# Patient Record
Sex: Female | Born: 1970 | Race: White | Hispanic: No | Marital: Married | Smoking: Current every day smoker
Health system: Southern US, Community
[De-identification: ages and names within clinical notes are randomized; demographics above are authoritative.]

## PROBLEM LIST (undated history)

## (undated) DIAGNOSIS — M40202 Unspecified kyphosis, cervical region: Secondary | ICD-10-CM

## (undated) DIAGNOSIS — I2510 Atherosclerotic heart disease of native coronary artery without angina pectoris: Secondary | ICD-10-CM

## (undated) DIAGNOSIS — R112 Nausea with vomiting, unspecified: Secondary | ICD-10-CM

## (undated) DIAGNOSIS — T4145XA Adverse effect of unspecified anesthetic, initial encounter: Secondary | ICD-10-CM

## (undated) DIAGNOSIS — Z9889 Other specified postprocedural states: Secondary | ICD-10-CM

## (undated) DIAGNOSIS — T8859XA Other complications of anesthesia, initial encounter: Secondary | ICD-10-CM

## (undated) DIAGNOSIS — G8929 Other chronic pain: Secondary | ICD-10-CM

## (undated) DIAGNOSIS — M19019 Primary osteoarthritis, unspecified shoulder: Secondary | ICD-10-CM

## (undated) HISTORY — DX: Atherosclerotic heart disease of native coronary artery without angina pectoris: I25.10

## (undated) HISTORY — PX: TONSILLECTOMY: SUR1361

## (undated) HISTORY — DX: Unspecified kyphosis, cervical region: M40.202

## (undated) HISTORY — DX: Primary osteoarthritis, unspecified shoulder: M19.019

## (undated) HISTORY — PX: FOOT SURGERY: SHX648

## (undated) HISTORY — PX: APPENDECTOMY: SHX54

## (undated) HISTORY — PX: ABDOMINAL HYSTERECTOMY: SHX81

## (undated) HISTORY — DX: Other chronic pain: G89.29

## (undated) HISTORY — PX: TUBAL LIGATION: SHX77

---

## 1997-09-10 ENCOUNTER — Ambulatory Visit (HOSPITAL_COMMUNITY): Admission: RE | Admit: 1997-09-10 | Discharge: 1997-09-10 | Payer: Self-pay | Admitting: Obstetrics and Gynecology

## 1998-01-06 ENCOUNTER — Encounter (HOSPITAL_COMMUNITY): Admission: RE | Admit: 1998-01-06 | Discharge: 1998-01-20 | Payer: Self-pay | Admitting: Obstetrics and Gynecology

## 1998-01-10 ENCOUNTER — Inpatient Hospital Stay (HOSPITAL_COMMUNITY): Admission: AD | Admit: 1998-01-10 | Discharge: 1998-01-10 | Payer: Self-pay | Admitting: Obstetrics & Gynecology

## 1998-01-20 ENCOUNTER — Inpatient Hospital Stay (HOSPITAL_COMMUNITY): Admission: AD | Admit: 1998-01-20 | Discharge: 1998-01-23 | Payer: Self-pay | Admitting: Obstetrics & Gynecology

## 1998-02-12 ENCOUNTER — Emergency Department (HOSPITAL_COMMUNITY): Admission: EM | Admit: 1998-02-12 | Discharge: 1998-02-12 | Payer: Self-pay | Admitting: Emergency Medicine

## 1999-04-16 ENCOUNTER — Encounter: Payer: Self-pay | Admitting: Emergency Medicine

## 1999-04-16 ENCOUNTER — Emergency Department (HOSPITAL_COMMUNITY): Admission: EM | Admit: 1999-04-16 | Discharge: 1999-04-16 | Payer: Self-pay | Admitting: Emergency Medicine

## 1999-05-10 ENCOUNTER — Encounter: Admission: RE | Admit: 1999-05-10 | Discharge: 1999-05-18 | Payer: Self-pay | Admitting: Family Medicine

## 1999-06-23 ENCOUNTER — Other Ambulatory Visit: Admission: RE | Admit: 1999-06-23 | Discharge: 1999-06-23 | Payer: Self-pay | Admitting: Obstetrics & Gynecology

## 1999-06-23 ENCOUNTER — Encounter (INDEPENDENT_AMBULATORY_CARE_PROVIDER_SITE_OTHER): Payer: Self-pay

## 1999-12-23 ENCOUNTER — Inpatient Hospital Stay (HOSPITAL_COMMUNITY): Admission: RE | Admit: 1999-12-23 | Discharge: 1999-12-25 | Payer: Self-pay | Admitting: Obstetrics & Gynecology

## 1999-12-23 ENCOUNTER — Encounter (INDEPENDENT_AMBULATORY_CARE_PROVIDER_SITE_OTHER): Payer: Self-pay | Admitting: Specialist

## 2004-02-27 ENCOUNTER — Other Ambulatory Visit: Admission: RE | Admit: 2004-02-27 | Discharge: 2004-02-27 | Payer: Self-pay | Admitting: Family Medicine

## 2004-03-29 ENCOUNTER — Encounter (INDEPENDENT_AMBULATORY_CARE_PROVIDER_SITE_OTHER): Payer: Self-pay | Admitting: *Deleted

## 2004-03-29 ENCOUNTER — Ambulatory Visit (HOSPITAL_COMMUNITY): Admission: RE | Admit: 2004-03-29 | Discharge: 2004-03-29 | Payer: Self-pay | Admitting: Gastroenterology

## 2005-08-02 ENCOUNTER — Encounter: Admission: RE | Admit: 2005-08-02 | Discharge: 2005-08-02 | Payer: Self-pay | Admitting: Family Medicine

## 2008-09-23 ENCOUNTER — Encounter: Admission: RE | Admit: 2008-09-23 | Discharge: 2008-09-23 | Payer: Self-pay | Admitting: Family Medicine

## 2010-06-20 ENCOUNTER — Emergency Department (HOSPITAL_COMMUNITY)
Admission: EM | Admit: 2010-06-20 | Discharge: 2010-06-20 | Disposition: A | Discharge status: Home / Self Care | Payer: Self-pay | Attending: Emergency Medicine | Admitting: Emergency Medicine

## 2010-06-20 ENCOUNTER — Emergency Department (HOSPITAL_COMMUNITY): Payer: Self-pay

## 2010-06-20 DIAGNOSIS — J3489 Other specified disorders of nose and nasal sinuses: Secondary | ICD-10-CM | POA: Insufficient documentation

## 2010-06-20 DIAGNOSIS — R5383 Other fatigue: Secondary | ICD-10-CM | POA: Insufficient documentation

## 2010-06-20 DIAGNOSIS — R609 Edema, unspecified: Secondary | ICD-10-CM | POA: Insufficient documentation

## 2010-06-20 DIAGNOSIS — IMO0001 Reserved for inherently not codable concepts without codable children: Secondary | ICD-10-CM | POA: Insufficient documentation

## 2010-06-20 DIAGNOSIS — F3289 Other specified depressive episodes: Secondary | ICD-10-CM | POA: Insufficient documentation

## 2010-06-20 DIAGNOSIS — R5381 Other malaise: Secondary | ICD-10-CM | POA: Insufficient documentation

## 2010-06-20 DIAGNOSIS — Z79899 Other long term (current) drug therapy: Secondary | ICD-10-CM | POA: Insufficient documentation

## 2010-06-20 DIAGNOSIS — R070 Pain in throat: Secondary | ICD-10-CM | POA: Insufficient documentation

## 2010-06-20 DIAGNOSIS — F329 Major depressive disorder, single episode, unspecified: Secondary | ICD-10-CM | POA: Insufficient documentation

## 2010-06-20 LAB — DIFFERENTIAL
Basophils Absolute: 0 10*3/uL (ref 0.0–0.1)
Basophils Relative: 1 % (ref 0–1)
Eosinophils Absolute: 0.2 10*3/uL (ref 0.0–0.7)
Eosinophils Relative: 2 % (ref 0–5)
Lymphocytes Relative: 27 % (ref 12–46)
Lymphs Abs: 1.8 10*3/uL (ref 0.7–4.0)
Monocytes Absolute: 0.4 10*3/uL (ref 0.1–1.0)
Monocytes Relative: 6 % (ref 3–12)
Neutro Abs: 4.3 10*3/uL (ref 1.7–7.7)
Neutrophils Relative %: 64 % (ref 43–77)

## 2010-06-20 LAB — URINE MICROSCOPIC-ADD ON

## 2010-06-20 LAB — POCT PREGNANCY, URINE: Preg Test, Ur: NEGATIVE

## 2010-06-20 LAB — CBC
Hemoglobin: 12.2 g/dL (ref 12.0–15.0)
MCHC: 34.7 g/dL (ref 30.0–36.0)
WBC: 6.7 10*3/uL (ref 4.0–10.5)

## 2010-06-20 LAB — URINALYSIS, ROUTINE W REFLEX MICROSCOPIC
Glucose, UA: NEGATIVE mg/dL
Leukocytes, UA: NEGATIVE
Protein, ur: NEGATIVE mg/dL
Specific Gravity, Urine: 1.018 (ref 1.005–1.030)
Urobilinogen, UA: 0.2 mg/dL (ref 0.0–1.0)

## 2010-06-20 LAB — BASIC METABOLIC PANEL
Calcium: 9 mg/dL (ref 8.4–10.5)
Chloride: 104 mEq/L (ref 96–112)
Creatinine, Ser: <0.47 mg/dL (ref 0.4–1.2)
Sodium: 137 mEq/L (ref 135–145)

## 2010-06-20 LAB — GLUCOSE, CAPILLARY: Glucose-Capillary: 145 mg/dL — ABNORMAL HIGH (ref 70–99)

## 2010-06-25 NOTE — H&P (Signed)
[facility]  Patient:    Vicki Pena, Vicki Pena                        MRN: [id number] Adm. Date:  12/23/99 Attending:  Gerrit Friends. Aldona Bar, M.D.                         History and Physical  HISTORY:  This patient is a 40 year old, gravida 2, para 3 (one set of twins), admitted for total abdominal hysterectomy with a preoperative diagnosis of worsening menorrhagia and dysmenorrhea.  This problem has been going on essentially for about ten months.  It has not responded to birth control pills or endometrial sampling which was carried out in May 2001, which was essentially within normal limits.  The patients thyroid function likewise has been measured and was found to be within normal limits.  In late September, when the patient presented with a desire to proceed with definitive therapy (hysterectomy), she was also having some stress urinary incontinence which to date has [facility] with physical therapy and the use of the Neotonus chair with improvement.  Therefore, the surgical treatment of her stress urinary incontinence will not be carried out at this time as there has been considerable improvement in her symptomatology as a result of her attendance at the [facility] and [facility].  The patients recent cervical cytology was normal.  Her last delivery was in December 1999, and at that time she underwent a primary low transverse cesarean section and tubal sterilization procedure for permanent elective sterilization at 38-1/2 weeks into her pregnancy.  She had a complication with pregnancy induced hypertension and a twin pregnancy.  She delivered, as mentioned, by cesarean section and did well.  During the entire year of 2000, she did well but began having worsening menorrhagia and dysmenorrhea as 2001 progressed.  She is admitted at this time for total abdominal hysterectomy with a strong suspicion of  adenomyosis as a contributing factor.  PAST MEDICAL HISTORY:  The patient has no known allergies.  She is currently on no medications.  Previous surgery only includes the aforementioned cesarean section and tubal sterilization in December 1999.  FAMILY HISTORY/SOCIAL HISTORY:  Noncontributory.  REVIEW OF SYSTEMS:  Negative, with the exception of above.  PHYSICAL EXAMINATION:  GENERAL:  Examination at the time of admission finds a well-developed female.  VITAL SIGNS:  Weight 196, blood pressure 110/70, respirations 18 and regular, pulse 80 and regular, temperature 98.2.  HEENT:  Negative.  Thyroid not enlarged.  CHEST:  Clear to auscultation and percussion.  BREASTS:  Negative.  CARDIOVASCULAR:  Regular rhythm.  No murmur.  ABDOMEN:  No masses are felt.  There is a well healed Pfannenstiel incision.  PELVIC:  The uterus is normal to upper limits of normal size, relatively immobile, tender to palpation, and adnexal areas are negative.  RECTOVAGINAL:  Confirmatory.  EXTREMITIES:  Negative.  NEUROLOGICAL:  Physiologic.  IMPRESSION:  Menorrhagia and dysmenorrhea - suspicious for adenomyosis.  PLAN:  Total abdominal hysterectomy. DD:  12/21/99 TD:  12/21/99 Job: [id number]

## 2010-06-25 NOTE — Op Note (Signed)
[facility]  Patient:    Vicki Pena, GROUNDS                MRN: [id number] Proc. Date: 12/23/99 Adm. Date:  [id number] Attending:  Mickle Mallory                           Operative Report  AGE:  40  PREOPERATIVE DIAGNOSES: 1. Menorrhea. 2. Dysmenorrhea. 3. Suspected adenomyosis.  POSTOPERATIVE DIAGNOSES: 1. Menorrhea. 2. Dysmenorrhea. 3. Suspected adenomyosis.  PATHOLOGY:  Pending.  PROCEDURE:  Total abdominal hysterectomy.  SURGEON:  Gerrit Friends. Aldona Bar, M.D.  ASSISTANT:  Luvenia Redden, M.D.  ANESTHESIA:  General endotracheal.  DESCRIPTION OF PROCEDURE:  The patient was taken to the operating room.  After satisfactory induction of general endotracheal anesthesia, she was prepped and draped, having been placed in the supine position in the usual manner for abdominal surgery with the Foley catheter inserted as part of the prep.  At this time, the procedure was begun.  A Pfannenstiel incision was made with minimal difficulty and dissected down to and through the fascia in a low transverse fascia with hemostasis created at each layer.  A subfascial space was created inferiorly and superiorly.  The muscle was separated in the midline.  The peritoneum was identified and entered appropriately with care taken to avoid bowel superiorly and bladder inferiorly.  At this time, the abdomen was inspected.  The upper abdomen felt normal as did both kidneys. The uterus was relatively mobile and not significantly enlarged.  Both ovaries likewise looked and felt normal.  The appendix was surgically absent.  At this time, the self-retaining retractor was placed and bowel packed off without difficulty.  A hysterectomy at this time was begun in the usual fashion.  Long Kelly clamps elevated the corners of the uterus.  With minimal difficulty, the round ligaments were suture secured.  With the aid of the Bovie, the round ligaments were cut and the  bladder flap was created.  The ovarian pedicle was then clamped and doubly suture secured and both ovaries were left in.  At this time, the bladder flap was created nicely.  Ureters were identified and uterine artery pedicles were clamped, cut, and suture secured with 0 Vicryl suture.  Additional parametrial bites were taken in a similar fashion down to the vaginal angle, which was secured using curved Heaney clamps and suture ligature thereafter of 0 Vicryl suture.  This was carried out.  The uterosacral pedicles were taken in a similar fashion bilaterally.  Once the vaginal angles were secured, the specimen was removed, removing the uterus and cervix in its entirety.  The remainder of the vaginal cuff was then closed with 0 Vicryl in a figure-of-eight fashion.  Perfuse irrigation was carried out and hemostasis was created.  The ovarian pedicles were then attached to the round ligaments bilaterally, thus elevating them out of the pelvis.  Again, irrigation was carried out and hemostasis was noted to be excellent.  At this time, with the procedure being completed, all packs were removed, all retractors were removed, all counts were noted to be correct, and no foreign bodies were noted to be remaining in the abdominal cavity.  Closure of the abdomen at this time was begun in layers.  The abdominal peritoneum was closed with 0 Vicryl in a running fashion. Muscle secured with same.  Assured of good subfascial hemostasis, the fascia was then secured with 0 Vicryl from angle  to midline bilaterally.  The subcutaneous tissues were then rendered hemostatic and staples were then used to close the skin.  A sterile pressure dressing was applied.  At this time, the patient was transported to the recovery room in satisfactory condition, having tolerated the procedure well.  Estimated blood loss 200 cc. All counts were noted to be correct.  The patient had good clear urine output during the procedure.   In the recovery room, she was noted to be in stable condition. DD:  12/23/99 TD:  12/23/99 Job: [id number]

## 2010-06-25 NOTE — Discharge Summary (Signed)
[facility]  Patient:    Vicki Pena, Vicki Pena                MRN: [id number] Adm. Date:  [id number] Disc. Date: 12/25/99 Attending:  Mickle Mallory                           Discharge Summary  DISCHARGE DIAGNOSIS: 1. Menorrhagia. 2. Dysmenorrhea. 3. Suspected adenomyosis.  PROCEDURE:  Total abdominal hysterectomy - pathology pending.  HOSPITAL COURSE:  This 40 year old female was admitted after a total abdominal hysterectomy because of worsening menorrhagia and dysmenorrhea with a diagnosis of suspected adenomyosis.  The procedure went well and was carried out after a normal preoperative workup.  Her postoperative course was totally benign.  Her discharge hemoglobin was 11.9.  She remained totally afebrile during her hospital course and on the morning was ambulating well, tolerating a regular diet well, having normal bowel and bladder function, was afebrile, her wound was clean and dry, and she was deemed ready for discharge. DISCHARGE INSTRUCTIONS:  Accordingly, she was given all appropriate instructions at the time of discharge.  DISCHARGE MEDICATIONS:  She was given prescriptions for: 1. Motrin 600 mg q.6h. 2. Tylox one to two q.4-6h. p.r.n. pain. 3. She will over-the-counter Doxidan and/or Mylicon as needed for GI problems.  FOLLOWUP:  She will return to the office in 48 hours for staple removal and Steri-Strip placement.  As mentioned, pathology report at the time of discharge pending.  CONDITION ON DISCHARGE:  Improved. DD:  12/25/99 TD:  12/26/99 Job: [id number]

## 2010-06-25 NOTE — Op Note (Signed)
Vicki Pena, Vicki Pena               ACCOUNT NO.:  [id number]   MEDICAL RECORD NO.:  [id number]          PATIENT TYPE:  AMB   LOCATION:  ENDO                         FACILITY:  [facility]   PHYSICIAN:  Graylin Shiver, M.D.   DATE OF BIRTH:  1970/02/08   DATE OF PROCEDURE:  03/29/2004  DATE OF DISCHARGE:                                 OPERATIVE REPORT   PROCEDURE:  Colonoscopy with biopsy.   INDICATIONS:  Rectal bleeding.   Informed consent was obtained after explanation of the risks of bleeding,  infection and perforation.   PREMEDICATION:  Fentanyl 100 mcg IV, Versed 10 mg IV.   PROCEDURE:  With the patient in the left lateral decubitus position a rectal  exam was performed. No masses were felt. The Olympus colonoscope was  inserted into the rectum and advanced around the colon to the cecum. Cecal  landmarks were identified. The ileocecal valve appeared prominent the  ileocecal valve was intubated and the first few centimeters of terminal  ileum were inspected and looked normal. The cecum looked normal. The  ileocecal valve was prominent. Biopsies were obtained from the prominent  ileocecal valve for histological inspection. The ascending colon looked  normal. The transverse colon looked normal. The descending colon and sigmoid  looked normal.  In the rectum there were 3 small polyps that were biopsied  off with cold forceps.  The scope was retroflexed in the rectum and no  specific abnormalities were noted. She tolerated the procedure well without  complications.   IMPRESSION:  1.  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  2.  Three small rectal polyps biopsied off.   PLAN:  The pathology will be checked.      SFG/MEDQ  D:  03/29/2004  T:  03/29/2004  Job:  [id number]   cc:   Donia Guiles, M.D.  [address] [place] [employer]  [place] [address]  Fax: [phone]

## 2011-12-03 ENCOUNTER — Emergency Department (HOSPITAL_COMMUNITY)
Admission: EM | Admit: 2011-12-03 | Discharge: 2011-12-03 | Disposition: A | Discharge status: Home / Self Care | Payer: Self-pay | Attending: Emergency Medicine | Admitting: Emergency Medicine

## 2011-12-03 DIAGNOSIS — N390 Urinary tract infection, site not specified: Secondary | ICD-10-CM | POA: Insufficient documentation

## 2011-12-03 DIAGNOSIS — R000 Tachycardia, unspecified: Secondary | ICD-10-CM | POA: Insufficient documentation

## 2011-12-03 DIAGNOSIS — Z79899 Other long term (current) drug therapy: Secondary | ICD-10-CM | POA: Insufficient documentation

## 2011-12-03 DIAGNOSIS — R300 Dysuria: Secondary | ICD-10-CM | POA: Insufficient documentation

## 2011-12-03 LAB — URINALYSIS, ROUTINE W REFLEX MICROSCOPIC
Glucose, UA: NEGATIVE mg/dL
Ketones, ur: 15 mg/dL — AB
pH: 6 (ref 5.0–8.0)

## 2011-12-03 LAB — URINE MICROSCOPIC-ADD ON

## 2011-12-03 MED ORDER — PROPOFOL 10 MG/ML IV EMUL
INTRAVENOUS | Status: AC
  Filled 2011-12-03: qty 100

## 2011-12-03 MED ORDER — SULFAMETHOXAZOLE-TMP DS 800-160 MG PO TABS
1.0000 | ORAL_TABLET | Freq: Once | ORAL | Status: AC
  Administered 2011-12-03: 1 via ORAL
  Filled 2011-12-03: qty 1

## 2011-12-03 MED ORDER — PHENAZOPYRIDINE HCL 200 MG PO TABS: 200.0000 mg | ORAL_TABLET | Freq: Three times a day (TID) | ORAL | Status: DC

## 2011-12-03 MED ORDER — ETOMIDATE 2 MG/ML IV SOLN
INTRAVENOUS | Status: AC
  Filled 2011-12-03: qty 20

## 2011-12-03 MED ORDER — SUCCINYLCHOLINE CHLORIDE 20 MG/ML IJ SOLN
INTRAMUSCULAR | Status: AC
  Filled 2011-12-03: qty 1

## 2011-12-03 MED ORDER — SULFAMETHOXAZOLE-TRIMETHOPRIM 800-160 MG PO TABS: 1.0000 | ORAL_TABLET | Freq: Two times a day (BID) | ORAL | Status: AC

## 2011-12-03 MED ORDER — LIDOCAINE HCL (CARDIAC) 20 MG/ML IV SOLN
INTRAVENOUS | Status: AC
  Filled 2011-12-03: qty 5

## 2011-12-03 MED ORDER — ROCURONIUM BROMIDE 50 MG/5ML IV SOLN
INTRAVENOUS | Status: AC
  Filled 2011-12-03: qty 2

## 2011-12-03 MED ORDER — FLUCONAZOLE 200 MG PO TABS: 200.0000 mg | ORAL_TABLET | Freq: Every day | ORAL | Status: AC

## 2011-12-03 NOTE — ED Provider Notes (Signed)
History     [name]: [id number]  Arrival date & time 12/03/11  7829   First MD Initiated Contact with Patient 12/03/11 [phone]      Chief Complaint  Patient presents with  . Urinary Frequency    HPI  The patient presents concerns of increased dysuria and urinary frequency.  Symptoms began approximately one week ago.  Since onset symptoms have been progressive.  She notes mild improvement with azo.  No associated fevers, chills, vomiting.  No other focal complaints. The patient has a history of UTI in the distant past.  She states the symptoms are similar. The patient had a hysterectomy  No past medical history on file.  No past surgical history on file.  No family history on file.  History  Substance Use Topics  . Smoking status: Not on file  . Smokeless tobacco: Not on file  . Alcohol Use: Not on file    OB History    No data available      Review of Systems  All other systems reviewed and are negative.    Allergies  Review of patient's allergies indicates no known allergies.  Home Medications   Current Outpatient Rx  Name Route Sig Dispense Refill  . AZO TABS PO Oral Take 1 tablet by mouth daily as needed. Takes 1 tab up to 6 times daily as needed for urinary tract discomfort    . FLUCONAZOLE 200 MG PO TABS Oral Take 1 tablet (200 mg total) by mouth daily. 7 tablet 0  . PHENAZOPYRIDINE HCL 200 MG PO TABS Oral Take 1 tablet (200 mg total) by mouth 3 (three) times daily. 6 tablet 0  . SULFAMETHOXAZOLE-TRIMETHOPRIM 800-160 MG PO TABS Oral Take 1 tablet by mouth 2 (two) times daily. 10 tablet 0    BP 132/85  Pulse 110  Temp 98.2 F (36.8 C) (Oral)  Resp 20  SpO2 97%  LMP 02/07/1998  Physical Exam  Nursing note and vitals reviewed. Constitutional: She is oriented to person, place, and time. She appears well-developed and well-nourished. No distress.  HENT:  Head: Normocephalic and atraumatic.  Eyes: Conjunctivae normal and EOM are normal.  Cardiovascular:  Normal rate and regular rhythm.   Pulmonary/Chest: Effort normal and breath sounds normal. No stridor. No respiratory distress.  Abdominal: She exhibits no distension.  Musculoskeletal: She exhibits no edema.  Neurological: She is alert and oriented to person, place, and time. No cranial nerve deficit.  Skin: Skin is warm and dry.  Psychiatric: She has a normal mood and affect.    ED Course  Procedures (including critical care time)  Labs Reviewed  URINALYSIS, ROUTINE W REFLEX MICROSCOPIC - Abnormal; Notable for the following:    Color, Urine ORANGE (*)  BIOCHEMICALS MAY BE AFFECTED BY COLOR   APPearance CLOUDY (*)     Hgb urine dipstick MODERATE (*)     Bilirubin Urine SMALL (*)     Ketones, ur 15 (*)     Urobilinogen, UA 2.0 (*)     Nitrite POSITIVE (*)     Leukocytes, UA MODERATE (*)     All other components within normal limits  URINE MICROSCOPIC-ADD ON - Abnormal; Notable for the following:    Bacteria, UA MANY (*)     All other components within normal limits  URINE CULTURE   No results found.   1. UTI (lower urinary tract infection)       MDM  The patient presents with concerns of dysuria, urinary frequency.  On  exam she is mildly tachycardic, but afebrile and in no distress.  The patient's labs are consistent with a urinary tract infection.  Absent distress, fever, there is low suspicion for pyelonephritis.  The patient requested antifungal to go with her antibiotics.  This was accommodated.     Gerhard Munch, MD 12/03/11 [phone]

## 2011-12-05 LAB — URINE CULTURE

## 2011-12-06 NOTE — ED Notes (Signed)
+  Urine Treated with Septra-Resistant to same-chart sent to EDP office for review.

## 2011-12-07 ENCOUNTER — Emergency Department (HOSPITAL_COMMUNITY)
Admission: EM | Admit: 2011-12-07 | Discharge: 2011-12-07 | Disposition: A | Discharge status: Home / Self Care | Payer: Self-pay | Attending: Emergency Medicine | Admitting: Emergency Medicine

## 2011-12-07 ENCOUNTER — Encounter (HOSPITAL_COMMUNITY): Payer: Self-pay | Admitting: Emergency Medicine

## 2011-12-07 DIAGNOSIS — F17200 Nicotine dependence, unspecified, uncomplicated: Secondary | ICD-10-CM | POA: Insufficient documentation

## 2011-12-07 DIAGNOSIS — N12 Tubulo-interstitial nephritis, not specified as acute or chronic: Secondary | ICD-10-CM | POA: Insufficient documentation

## 2011-12-07 DIAGNOSIS — Z79899 Other long term (current) drug therapy: Secondary | ICD-10-CM | POA: Insufficient documentation

## 2011-12-07 LAB — URINALYSIS, ROUTINE W REFLEX MICROSCOPIC
Nitrite: POSITIVE — AB
Specific Gravity, Urine: 1.028 (ref 1.005–1.030)
pH: 5 (ref 5.0–8.0)

## 2011-12-07 LAB — URINE MICROSCOPIC-ADD ON

## 2011-12-07 MED ORDER — SODIUM CHLORIDE 0.9 % IV SOLN
1000.0000 mL | Freq: Once | INTRAVENOUS | Status: AC
  Administered 2011-12-07: 1000 mL via INTRAVENOUS

## 2011-12-07 MED ORDER — HYDROMORPHONE HCL PF 1 MG/ML IJ SOLN
1.0000 mg | Freq: Once | INTRAMUSCULAR | Status: AC
  Administered 2011-12-07: 1 mg via INTRAVENOUS
  Filled 2011-12-07: qty 1

## 2011-12-07 MED ORDER — DEXTROSE 5 % IV SOLN
1.0000 g | Freq: Once | INTRAVENOUS | Status: AC
  Administered 2011-12-07: 1 g via INTRAVENOUS
  Filled 2011-12-07: qty 10

## 2011-12-07 MED ORDER — CEPHALEXIN 500 MG PO CAPS: 500.0000 mg | ORAL_CAPSULE | Freq: Four times a day (QID) | ORAL | Status: DC

## 2011-12-07 MED ORDER — PHENAZOPYRIDINE HCL 200 MG PO TABS: 200.0000 mg | ORAL_TABLET | Freq: Three times a day (TID) | ORAL | Status: DC

## 2011-12-07 MED ORDER — HYDROCODONE-ACETAMINOPHEN 5-325 MG PO TABS: 1.0000 | ORAL_TABLET | ORAL | Status: DC | PRN

## 2011-12-07 MED ORDER — SODIUM CHLORIDE 0.9 % IV SOLN: 1000.0000 mL | INTRAVENOUS | Status: DC

## 2011-12-07 NOTE — ED Notes (Addendum)
C/o UTI symptoms x 1 week. Seen here for same last Saturday but not getting better. Continues with dysuria, frequency & now pain worse & also having chills. Denies hematuria

## 2011-12-07 NOTE — ED Provider Notes (Signed)
History     [name]: [id number]  Arrival date & time 12/07/11  1533   First MD Initiated Contact with Patient 12/07/11 1718      Chief Complaint  Patient presents with  . Urinary Tract Infection     The history is provided by the patient and medical records.   patient was seen for her symptoms consistent with urinary tract infection 4 days ago.  She was started on Bactrim.  She states that her symptoms did not seem to be improving.  She continues to have dysuria and urinary frequency.  She has suprapubic pain.  She reports nausea without vomiting.  She reports chills without documented fever.  She reports that she just continues to feel worse.  She denies flank pain.  She has no history of kidney stones.  She denies vaginal complaints.  She reports her symptoms are mild to moderate in severity.  Nothing worsens or improves her symptoms.  She's been compliant with the Bactrim.  Review of the medical records demonstrates urine culture was obtained and the urine is growing out Escherichia coli which is resistant to Bactrim.  It is otherwise sensitive to all other antibiotics  History reviewed. No pertinent past medical history.  Past Surgical History  Procedure Date  . Abdominal hysterectomy   . Tonsillectomy   . Cesarean section   . Foot surgery     History reviewed. No pertinent family history.  History  Substance Use Topics  . Smoking status: Current Every Day Smoker -- 1.0 packs/day    Types: Cigarettes  . Smokeless tobacco: Not on file  . Alcohol Use: Yes     occasion    OB History    Grav Para Term Preterm Abortions TAB SAB Ect Mult Living                  Review of Systems  All other systems reviewed and are negative.    Allergies  Review of patient's allergies indicates no known allergies.  Home Medications   Current Outpatient Rx  Name Route Sig Dispense Refill  . FLUCONAZOLE 200 MG PO TABS Oral Take 1 tablet (200 mg total) by mouth daily. 7 tablet 0  .  PHENAZOPYRIDINE HCL 200 MG PO TABS Oral Take 1 tablet (200 mg total) by mouth 3 (three) times daily. 6 tablet 0  . AZO TABS PO Oral Take 1 tablet by mouth daily as needed. Takes 1 tab up to 6 times daily as needed for urinary tract discomfort    . SULFAMETHOXAZOLE-TRIMETHOPRIM 800-160 MG PO TABS Oral Take 1 tablet by mouth 2 (two) times daily. 10 tablet 0  . CEPHALEXIN 500 MG PO CAPS Oral Take 1 capsule (500 mg total) by mouth 4 (four) times daily. 40 capsule 0  . HYDROCODONE-ACETAMINOPHEN 5-325 MG PO TABS Oral Take 1 tablet by mouth every 4 (four) hours as needed for pain. 20 tablet 0  . PHENAZOPYRIDINE HCL 200 MG PO TABS Oral Take 1 tablet (200 mg total) by mouth 3 (three) times daily. 6 tablet 0    BP 134/81  Pulse 78  Temp 97.7 F (36.5 C) (Oral)  Resp 18  SpO2 96%  LMP 02/07/1998  Physical Exam  Nursing note and vitals reviewed. Constitutional: She is oriented to person, place, and time. She appears well-developed and well-nourished. No distress.  HENT:  Head: Normocephalic and atraumatic.  Eyes: EOM are normal.  Neck: Normal range of motion.  Cardiovascular: Normal rate, regular rhythm and normal heart sounds.  Pulmonary/Chest: Effort normal and breath sounds normal.  Abdominal: Soft. She exhibits no distension. There is no tenderness.       Mild suprapubic tenderness  Musculoskeletal: Normal range of motion.  Neurological: She is alert and oriented to person, place, and time.  Skin: Skin is warm and dry.  Psychiatric: She has a normal mood and affect. Judgment normal.    ED Course  Procedures (including critical care time)  Labs Reviewed  URINALYSIS, ROUTINE W REFLEX MICROSCOPIC - Abnormal; Notable for the following:    Color, Urine ORANGE (*)  BIOCHEMICALS MAY BE AFFECTED BY COLOR   APPearance HAZY (*)     Hgb urine dipstick MODERATE (*)     Bilirubin Urine SMALL (*)     Ketones, ur 15 (*)     Protein, ur 30 (*)     Urobilinogen, UA 2.0 (*)     Nitrite POSITIVE  (*)     Leukocytes, UA MODERATE (*)     All other components within normal limits  URINE MICROSCOPIC-ADD ON - Abnormal; Notable for the following:    Squamous Epithelial / LPF MANY (*)     Bacteria, UA FEW (*)     All other components within normal limits  URINE CULTURE   No results found.   1. Pyelonephritis       MDM  7:44 PM The patient feels much better at this time.  I reviewed her urine culture.  Her urine demonstrated an Escherichia coli urinary tract infection that was pansensitive except for trimethoprim sulfamethoxazole which is what she was discharged home on.  The patient received a dose of IV Rocephin emergency department.  She feels much better.  Home with Keflex Pyridium and pain medicine.  She understands return to the ER for new or worsening symptoms.  No vomiting.  Tolerating oral liquids.      Lyanne Co, MD 12/08/11 [phone]

## 2011-12-07 NOTE — ED Notes (Signed)
Pt reports was here on Sat for UTI, and was tx and sent home on abx, has been taking abx as prescribed; reports increased lower abd spasm and urinary frequency, also c/o chills

## 2011-12-09 LAB — URINE CULTURE

## 2011-12-09 NOTE — ED Notes (Signed)
Patient return  And put on Kelfex by Dr Patria Mane.

## 2011-12-10 NOTE — ED Notes (Signed)
+  Urine. Patient treated with Keflex. Sensitive to same. Per protocol MD. °

## 2012-02-09 ENCOUNTER — Encounter: Payer: Self-pay | Admitting: Internal Medicine

## 2012-02-09 ENCOUNTER — Other Ambulatory Visit: Payer: Self-pay | Admitting: *Deleted

## 2012-02-09 ENCOUNTER — Ambulatory Visit: Payer: Self-pay | Admitting: Internal Medicine

## 2012-02-09 VITALS — BP 125/83 | HR 97 | Temp 98.2°F | Resp 16 | Ht 64.0 in | Wt 173.0 lb

## 2012-02-09 DIAGNOSIS — A499 Bacterial infection, unspecified: Secondary | ICD-10-CM

## 2012-02-09 DIAGNOSIS — N390 Urinary tract infection, site not specified: Secondary | ICD-10-CM

## 2012-02-09 DIAGNOSIS — R300 Dysuria: Secondary | ICD-10-CM

## 2012-02-09 DIAGNOSIS — R319 Hematuria, unspecified: Secondary | ICD-10-CM

## 2012-02-09 LAB — POCT URINALYSIS DIPSTICK
Protein, UA: 100
Urobilinogen, UA: 4

## 2012-02-09 LAB — POCT UA - MICROSCOPIC ONLY
Mucus, UA: NEGATIVE
Yeast, UA: NEGATIVE

## 2012-02-09 MED ORDER — PHENAZOPYRIDINE HCL 200 MG PO TABS: 200.0000 mg | ORAL_TABLET | Freq: Three times a day (TID) | ORAL | Status: DC

## 2012-02-09 MED ORDER — SULFAMETHOXAZOLE-TRIMETHOPRIM 800-160 MG PO TABS: 1.0000 | ORAL_TABLET | Freq: Two times a day (BID) | ORAL | Status: DC

## 2012-02-09 MED ORDER — CIPROFLOXACIN HCL 500 MG PO TABS: 500.0000 mg | ORAL_TABLET | Freq: Two times a day (BID) | ORAL | Status: DC

## 2012-02-09 NOTE — Progress Notes (Signed)
  Subjective:    Patient ID: Vicki Pena, female    DOB: 1970/02/23, 42 y.o.   MRN: [id number]  HPI  Dsyuria, burning with urination. Onset today . Blood in urine. No fever. No back pain. No nausea or vomiting. Had a previous uti 1 month ago . Had e coli and it was resistant to the first antibiotic so she had to take a second course of antibiotic. Review of Systems  All other systems reviewed and are negative.       Objective:   Physical Exam  Constitutional: She is oriented to person, place, and time. She appears well-developed and well-nourished.  HENT:  Head: Normocephalic and atraumatic.  Right Ear: External ear normal.  Left Ear: External ear normal.  Nose: Nose normal.  Mouth/Throat: Oropharynx is clear and moist.  Eyes: Conjunctivae normal and EOM are normal. Pupils are equal, round, and reactive to light.  Neck: Normal range of motion. Neck supple.  Cardiovascular: Normal rate, regular rhythm, normal heart sounds and intact distal pulses.   Pulmonary/Chest: Effort normal and breath sounds normal.  Abdominal: Soft. There is tenderness.       Suprapubic tenderness  Musculoskeletal: Normal range of motion.  Neurological: She is alert and oriented to person, place, and time. She has normal reflexes.  Skin: Skin is warm and dry.  Psychiatric: She has a normal mood and affect. Her behavior is normal. Judgment and thought content normal.     Results for orders placed in visit on 02/09/12  POCT URINALYSIS DIPSTICK      Component Value Range   Color, UA orange     Clarity, UA cloudy     Glucose, UA 250     Bilirubin, UA small     Ketones, UA trace     Spec Grav, UA 1.010     Blood, UA moderate     pH, UA 5.0     Protein, UA 100     Urobilinogen, UA 4.0     Nitrite, UA pos     Leukocytes, UA large (3+)    POCT UA - MICROSCOPIC ONLY      Component Value Range   WBC, Ur, HPF, POC 30-40     RBC, urine, microscopic 15-20     Bacteria, U Microscopic small     Mucus,  UA neg     Epithelial cells, urine per micros 3-6     Crystals, Ur, HPF, POC neg     Casts, Ur, LPF, POC neg     Yeast, UA neg     Urine with blood, wbc, nitrite bacteria.    Assessment & Plan:  Urinary tract infection, hematuria. Last uti was ecoli esistant to keflex and bactrim. Will obtain culture.Will treat withcipro1 tab 2 times daily for 10 days and pyriduim. Increase fluids. Acetaminophen for pain.urine culture obtained.

## 2012-02-09 NOTE — Patient Instructions (Addendum)
Increase fluids. Take meds as directed/. Any problems return to the office.

## 2012-02-13 ENCOUNTER — Telehealth: Payer: Self-pay

## 2012-02-13 LAB — URINE CULTURE: Colony Count: >=100000

## 2012-02-13 NOTE — Telephone Encounter (Signed)
Patient would like more pain medication if possible please call [phone]

## 2012-02-14 NOTE — Telephone Encounter (Signed)
What pain medication? Pyridium? Her culture shows that the cipro should have worked-if she is still needing pyridium, she should RTC so we can see if something else is going on.

## 2012-02-14 NOTE — Telephone Encounter (Signed)
Called patient to advise. Left message for her to call me back  

## 2012-02-14 NOTE — Telephone Encounter (Signed)
Pt was seen on 02/09/11.  Her lab results are back.

## 2012-02-15 ENCOUNTER — Telehealth: Payer: Self-pay

## 2012-02-15 NOTE — Telephone Encounter (Signed)
See notes under phone message from 02/15/12.

## 2012-02-15 NOTE — Telephone Encounter (Signed)
Yes she should finish the full course of Cipro.  I am not sure why there was Bactrim at the pharmacy, it does not look like Dr. Mindi Junker ordered this.  Pyridium is not to be continued for longer than 2 days, which is why Dr. Mindi Junker only ordered #6.  If she is still having symptoms she needs to return for further evaluation.

## 2012-02-15 NOTE — Telephone Encounter (Signed)
Advised pt that it is not recommended that she take Pyridium for more than 2 days and she should also not take any more of the Azo. Pt stated that pain is better today than yesterday, and she doesn't have ins, she will wait to see if she continues to improve on the cipro. Advised pt that she can use ibuprofen/tylenol for pain and try some heat on her abdomen or lower back. Also advised pt to make sure she takes her Abx w/food to lessen pt's slight nausea she is experiencing from Abx and to take probiotics to prevent more GI problems. Pt agreed.

## 2012-02-15 NOTE — Telephone Encounter (Signed)
Called pt and gave her results/instr's to complete her Cipro Abx (see notes under lab results). Pt reported that when she got to the pharmacy there were 3 Rx for her - the pyridium and cipro, but also Bactrim. Pt didn't know that provider was going to send in two different Abxs so she took the Bactrim first, and just started Cipro on Monday because she wasn't getting any better and really hurt on Monday. She stated that today might be slightly better, but requests some more Pyridium if possible - Dr Mindi Junker only gave her #6 tabs. I instr'd pt to finish whole round of cipro and CB if Sxs don't completely resolve. She is worried bc the last UTI she had they had to put her on twice as much Cipro for 20 days to get rid of it.  Chelle, can we send in more Pyridium for pt?

## 2012-02-15 NOTE — Telephone Encounter (Signed)
PT STATES SHE HAD A REALLY BAD UTI AND WOULD LIKE TO KNOW IF THE CULTURE HAD COME BACK, IS STILL TAKING ALL THE MEDICINE, BUT STILL HAVE THE BURNING PLEASE CALL AND ADVISE AT [phone]

## 2012-07-22 ENCOUNTER — Emergency Department (HOSPITAL_COMMUNITY)
Admission: EM | Admit: 2012-07-22 | Discharge: 2012-07-22 | Disposition: A | Discharge status: Home / Self Care | Payer: Self-pay | Attending: Emergency Medicine | Admitting: Emergency Medicine

## 2012-07-22 ENCOUNTER — Encounter (HOSPITAL_COMMUNITY): Payer: Self-pay | Admitting: Emergency Medicine

## 2012-07-22 DIAGNOSIS — R509 Fever, unspecified: Secondary | ICD-10-CM | POA: Insufficient documentation

## 2012-07-22 DIAGNOSIS — J020 Streptococcal pharyngitis: Secondary | ICD-10-CM | POA: Insufficient documentation

## 2012-07-22 DIAGNOSIS — R5381 Other malaise: Secondary | ICD-10-CM | POA: Insufficient documentation

## 2012-07-22 DIAGNOSIS — IMO0001 Reserved for inherently not codable concepts without codable children: Secondary | ICD-10-CM | POA: Insufficient documentation

## 2012-07-22 DIAGNOSIS — R61 Generalized hyperhidrosis: Secondary | ICD-10-CM | POA: Insufficient documentation

## 2012-07-22 DIAGNOSIS — R51 Headache: Secondary | ICD-10-CM | POA: Insufficient documentation

## 2012-07-22 DIAGNOSIS — F17200 Nicotine dependence, unspecified, uncomplicated: Secondary | ICD-10-CM | POA: Insufficient documentation

## 2012-07-22 DIAGNOSIS — R5383 Other fatigue: Secondary | ICD-10-CM | POA: Insufficient documentation

## 2012-07-22 LAB — BASIC METABOLIC PANEL
CO2: 23 mEq/L (ref 19–32)
Calcium: 9 mg/dL (ref 8.4–10.5)
Creatinine, Ser: 0.53 mg/dL (ref 0.50–1.10)
GFR calc Af Amer: >90 mL/min (ref >90)
GFR calc non Af Amer: >90 mL/min (ref >90)
Sodium: 135 mEq/L (ref 135–145)

## 2012-07-22 LAB — CBC WITH DIFFERENTIAL/PLATELET
Basophils Absolute: 0 10*3/uL (ref 0.0–0.1)
Basophils Relative: 0 % (ref 0–1)
Eosinophils Relative: 0 % (ref 0–5)
HCT: 39.5 % (ref 36.0–46.0)
Lymphocytes Relative: 6 % — ABNORMAL LOW (ref 12–46)
MCHC: 33.4 g/dL (ref 30.0–36.0)
MCV: 88 fL (ref 78.0–100.0)
Monocytes Absolute: 1 10*3/uL (ref 0.1–1.0)
Platelets: 279 10*3/uL (ref 150–400)
RDW: 13.2 % (ref 11.5–15.5)
WBC: 22.6 10*3/uL — ABNORMAL HIGH (ref 4.0–10.5)

## 2012-07-22 LAB — RAPID STREP SCREEN (MED CTR MEBANE ONLY): Streptococcus, Group A Screen (Direct): POSITIVE — AB

## 2012-07-22 MED ORDER — PENICILLIN G BENZATHINE 1200000 UNIT/2ML IM SUSP
1.2000 10*6.[IU] | Freq: Once | INTRAMUSCULAR | Status: AC
  Administered 2012-07-22: 1.2 10*6.[IU] via INTRAMUSCULAR
  Filled 2012-07-22: qty 2

## 2012-07-22 NOTE — ED Provider Notes (Signed)
History     [name]: [id number]  Arrival date & time 07/22/12  2116   None     Chief Complaint  Patient presents with  . Sore Throat  . Headache    (Consider location/radiation/quality/duration/timing/severity/associated sxs/prior treatment) Patient is a 42 y.o. female presenting with pharyngitis and headaches. The history is provided by the patient. No language interpreter was used.  Sore Throat This is a new problem. The current episode started in the past 7 days. The problem occurs constantly. The problem has been gradually worsening. Associated symptoms include chills, diaphoresis, fatigue, a fever (subjective), headaches (resolved), myalgias and a sore throat. Pertinent negatives include no abdominal pain, chest pain, congestion, nausea, neck pain, numbness, rash, vomiting or weakness. The symptoms are aggravated by swallowing, coughing, drinking and eating. She has tried nothing for the symptoms.  Headache Associated symptoms: fatigue, fever (subjective), myalgias and sore throat   Associated symptoms: no abdominal pain, no congestion, no nausea, no neck pain, no neck stiffness, no numbness and no vomiting     History reviewed. No pertinent past medical history.  Past Surgical History  Procedure Laterality Date  . Abdominal hysterectomy    . Tonsillectomy    . Cesarean section    . Foot surgery      No family history on file.  History  Substance Use Topics  . Smoking status: Current Every Day Smoker -- 1.00 packs/day    Types: Cigarettes  . Smokeless tobacco: Not on file  . Alcohol Use: Yes     Comment: occasion    OB History   Grav Para Term Preterm Abortions TAB SAB Ect Mult Living                  Review of Systems  Constitutional: Positive for fever (subjective), chills, diaphoresis and fatigue.  HENT: Positive for sore throat. Negative for congestion, rhinorrhea, trouble swallowing, neck pain and neck stiffness.        Discomfort with swallowing   Respiratory: Negative for shortness of breath and wheezing.   Cardiovascular: Negative for chest pain.  Gastrointestinal: Negative for nausea, vomiting and abdominal pain.  Genitourinary: Negative for dysuria and hematuria.  Musculoskeletal: Positive for myalgias.  Skin: Negative for rash.  Neurological: Positive for headaches (resolved). Negative for weakness and numbness.  All other systems reviewed and are negative.    Allergies  Review of patient's allergies indicates no known allergies.  Home Medications   Current Outpatient Rx  Name  Route  Sig  Dispense  Refill  . diphenhydrAMINE (BENADRYL) 12.5 MG/5ML elixir   Oral   Take 25 mg by mouth 4 (four) times daily as needed for itching or allergies.         . naproxen sodium (ANAPROX) 220 MG tablet   Oral   Take 220 mg by mouth 2 (two) times daily with a meal.           BP 107/65  Pulse 116  Temp(Src) 98.9 F (37.2 C) (Oral)  Resp 15  Ht 5\' 4"  (1.626 m)  Wt 170 lb (77.111 kg)  BMI 29.17 kg/m2  SpO2 96%  LMP 02/07/1998  Physical Exam  Nursing note and vitals reviewed. Constitutional: She is oriented to person, place, and time. She appears well-developed and well-nourished. No distress.  HENT:  Head: Normocephalic and atraumatic. No trismus in the jaw.  Right Ear: Tympanic membrane, external ear and ear canal normal. No tenderness. No mastoid tenderness.  Left Ear: Tympanic membrane, external ear and ear  canal normal. No tenderness. No mastoid tenderness.  Nose: Nose normal.  Mouth/Throat: Uvula is midline and mucous membranes are normal. Mucous membranes are not pale. No oral lesions. Posterior oropharyngeal erythema present. No posterior oropharyngeal edema or tonsillar abscesses.  Posterior pharyngeal erythema without edema. Airway patent. No stridor. Tonsils absent.  Eyes: Conjunctivae and EOM are normal. Pupils are equal, round, and reactive to light. Right eye exhibits no discharge. Left eye exhibits no  discharge. No scleral icterus.  Neck: Normal range of motion.  Cardiovascular: Normal rate, regular rhythm, normal heart sounds and intact distal pulses.   Pulmonary/Chest: Effort normal and breath sounds normal. No stridor. No respiratory distress. She has no wheezes. She has no rales. She exhibits no tenderness.  Abdominal: Soft. She exhibits no distension. There is no tenderness.  Musculoskeletal: Normal range of motion. She exhibits no edema.  Lymphadenopathy:    She has cervical adenopathy.  Neurological: She is alert and oriented to person, place, and time.  Skin: Skin is warm and dry. No rash noted. She is not diaphoretic. No erythema. No pallor.  Psychiatric: She has a normal mood and affect. Her behavior is normal.    ED Course  Procedures (including critical care time)  Labs Reviewed  RAPID STREP SCREEN - Abnormal; Notable for the following:    Streptococcus, Group A Screen (Direct) POSITIVE (*)    All other components within normal limits  CBC WITH DIFFERENTIAL - Abnormal; Notable for the following:    WBC 22.6 (*)    Neutrophils Relative % 90 (*)    Neutro Abs 20.2 (*)    Lymphocytes Relative 6 (*)    All other components within normal limits  BASIC METABOLIC PANEL - Abnormal; Notable for the following:    Glucose, Bld 111 (*)    All other components within normal limits   No results found.   1. Strep pharyngitis     MDM  Uncomplicated Strep throat. Rapid strep screen positive and labs significant for leukocytosis of 22.6. Physical exam significant for posterior oropharyngeal erythema. Airway patent and there is no stridor. Patient afebrile, swallowing secretions without difficulty. Patient treated in ED with IM Bicillin. Appropriate for discharge with primary care followup. Ibuprofen, saltwater gargles, and Chloraseptic spray advised for symptomatic improvement. Indications for ED return discussed. Patient states comfort and understanding with plan with no  unaddressed concerns.        Antony Madura, PA-C 07/23/12 [phone]

## 2012-07-22 NOTE — ED Notes (Signed)
PA at bedside.

## 2012-07-22 NOTE — ED Notes (Signed)
Pt alert, arrives from home, c/o fever, sore throat, onset several days ago, resp even unlabored, skin pwd, no stridor noted, tolerating oral secretions well

## 2012-07-31 NOTE — ED Provider Notes (Signed)
Medical screening examination/treatment/procedure(s) were performed by non-physician practitioner and as supervising physician I was immediately available for consultation/collaboration.  Doug Sou, MD 07/31/12 [phone]

## 2012-10-23 ENCOUNTER — Ambulatory Visit: Payer: Self-pay | Admitting: Physician Assistant

## 2012-10-23 VITALS — BP 124/76 | HR 93 | Temp 98.2°F | Resp 18 | Ht 64.4 in | Wt 175.4 lb

## 2012-10-23 DIAGNOSIS — R350 Frequency of micturition: Secondary | ICD-10-CM

## 2012-10-23 DIAGNOSIS — R300 Dysuria: Secondary | ICD-10-CM

## 2012-10-23 DIAGNOSIS — R109 Unspecified abdominal pain: Secondary | ICD-10-CM

## 2012-10-23 LAB — POCT URINALYSIS DIPSTICK
Glucose, UA: 100
Leukocytes, UA: NEGATIVE
Nitrite, UA: POSITIVE
Protein, UA: 100
Spec Grav, UA: >=1.03
Urobilinogen, UA: 2

## 2012-10-23 LAB — POCT UA - MICROSCOPIC ONLY: Yeast, UA: NEGATIVE

## 2012-10-23 MED ORDER — PHENAZOPYRIDINE HCL 200 MG PO TABS: 200.0000 mg | ORAL_TABLET | Freq: Three times a day (TID) | ORAL | Status: DC | PRN

## 2012-10-23 MED ORDER — FLUCONAZOLE 150 MG PO TABS: 150.0000 mg | ORAL_TABLET | Freq: Once | ORAL | Status: DC

## 2012-10-23 MED ORDER — CIPROFLOXACIN HCL 500 MG PO TABS: 500.0000 mg | ORAL_TABLET | Freq: Two times a day (BID) | ORAL | Status: DC

## 2012-10-23 NOTE — Progress Notes (Signed)
Patient ID: Vicki Pena MRN: [id number], DOB: 1970/05/09, 42 y.o. Date of Encounter: 10/23/2012, 9:19 PM  Primary Physician: Default, Provider, MD  Chief Complaint: urinary frequency and dysuria  HPI: 42 y.o. year old female with presents with 1 day history of urinary frequency, dysuria, and suprapubic pressure. Symptoms started suddenly this morning. Denies flank pain, nausea, vomiting, fever, chills, vaginal discharge, or odor. Has tried AZO which has helped slightly with the dysuria. Pt is s/p hysterectomy.  Does have hx of UTI's - has had 3 this year that revealed E. Coli by culture that is resistant to Septra.   Patient is otherwise doing well without issues or complaints.  History reviewed. No pertinent past medical history.   Home Meds: Prior to Admission medications   Medication Sig Start Date End Date Taking? Authorizing Provider  ciprofloxacin (CIPRO) 500 MG tablet Take 1 tablet (500 mg total) by mouth 2 (two) times daily. 10/23/12   Anani Gu Jaquita Rector, PA-C  diphenhydrAMINE (BENADRYL) 12.5 MG/5ML elixir Take 25 mg by mouth 4 (four) times daily as needed for itching or allergies.    Historical Provider, MD  fluconazole (DIFLUCAN) 150 MG tablet Take 1 tablet (150 mg total) by mouth once. Repeat if needed 10/23/12   Nelva Nay, PA-C  naproxen sodium (ANAPROX) 220 MG tablet Take 220 mg by mouth 2 (two) times daily with a meal.    Historical Provider, MD  phenazopyridine (PYRIDIUM) 200 MG tablet Take 1 tablet (200 mg total) by mouth 3 (three) times daily as needed for pain. 10/23/12   Nelva Nay, PA-C    Allergies:  Allergies  Allergen Reactions  . Amoxicillin Swelling    Face/neck swelling fever lips turned blue    History   Social History  . Marital Status: Single    Spouse Name: N/A    Number of Children: N/A  . Years of Education: N/A   Occupational History  . Not on file.   Social History Main Topics  . Smoking status: Current Every Day Smoker -- 1.00  packs/day    Types: Cigarettes  . Smokeless tobacco: Never Used  . Alcohol Use: Yes     Comment: occasion  . Drug Use: No  . Sexual Activity: Not on file   Other Topics Concern  . Not on file   Social History Narrative  . No narrative on file     Review of Systems: Constitutional: negative for chills, fever, night sweats, weight changes, or fatigue  HEENT: negative for vision changes, hearing loss, congestion, rhinorrhea, ST, epistaxis, or sinus pressure Cardiovascular: negative for chest pain or palpitations Respiratory: negative for cough, hemoptysis, wheezing, shortness of breath. Abdominal: positive for suprapubic abdominal pain,   No nausea, vomiting, diarrhea, or constipation Genitourinary: positive for urinary frequency and dysuria. Negative for vaginal discharge, odor, or pelvic pain.  Dermatological: negative for rashes. Neurologic: negative for headache, dizziness, or syncope   Physical Exam: Blood pressure 124/76, pulse 93, temperature 98.2 F (36.8 C), temperature source Oral, resp. rate 18, height 5' 4.4" (1.636 m), weight 175 lb 6.4 oz (79.561 kg), last menstrual period 02/07/1998, SpO2 97.00%., Body mass index is 29.73 kg/(m^2). General: Well developed, well nourished, in no acute distress. Head: Normocephalic, atraumatic, eyes without discharge, sclera non-icteric, nares are without discharge. External ear normal in appearance. Neck: Supple. No thyromegaly. Full ROM. No lymphadenopathy. Lungs: Clear bilaterally to auscultation without wheezes, rales, or rhonchi. Breathing is unlabored. Heart: RRR with S1 S2. No murmurs, rubs, or gallops  appreciated. Abdominal: +BS x 4. No hepatosplenomegaly, rebound tenderness, or guarding. Positive suprapubic tenderness. No CVA tenderness bilaterally.  Msk:  Strength and tone normal for age. Extremities/Skin: Warm and dry. No clubbing or cyanosis. No edema.  Neuro: Alert and oriented X 3. Moves all extremities spontaneously. Gait  is normal. CNII-XII grossly in tact. Psych:  Responds to questions appropriately with a normal affect.   Labs: Results for orders placed in visit on 10/23/12  POCT URINALYSIS DIPSTICK      Result Value Range   Color, UA Orange     Clarity, UA cloudy     Glucose, UA 100     Bilirubin, UA small     Ketones, UA 15     Spec Grav, UA >=1.030     Blood, UA neg     pH, UA 5.0     Protein, UA 100     Urobilinogen, UA 2.0     Nitrite, UA positive     Leukocytes, UA Negative    POCT UA - MICROSCOPIC ONLY      Result Value Range   WBC, Ur, HPF, POC tntc     RBC, urine, microscopic 1-2     Bacteria, U Microscopic 3+     Mucus, UA large     Epithelial cells, urine per micros 6-9     Crystals, Ur, HPF, POC neg     Casts, Ur, LPF, POC neg     Yeast, UA neg       ASSESSMENT AND PLAN:  42 y.o. year old female with urinary tract infection Urine culture not sent - patient declined due to cost.  She understands that she will need to RTC if symptoms not improving.  Increase fluids Cipro 500 mg bid x 5 days Azo as needed for symptomatic relief Follow up if symptoms worsen or fail to improve.  Grier Mitts, PA-C 10/23/2012 9:19 PM

## 2012-12-13 ENCOUNTER — Other Ambulatory Visit: Payer: Self-pay

## 2013-07-31 ENCOUNTER — Emergency Department (HOSPITAL_COMMUNITY)
Admission: EM | Admit: 2013-07-31 | Discharge: 2013-07-31 | Disposition: A | Discharge status: Home / Self Care | Payer: Self-pay | Attending: Emergency Medicine | Admitting: Emergency Medicine

## 2013-07-31 ENCOUNTER — Encounter (HOSPITAL_COMMUNITY): Payer: Self-pay | Admitting: Emergency Medicine

## 2013-07-31 DIAGNOSIS — Z791 Long term (current) use of non-steroidal anti-inflammatories (NSAID): Secondary | ICD-10-CM | POA: Insufficient documentation

## 2013-07-31 DIAGNOSIS — F17200 Nicotine dependence, unspecified, uncomplicated: Secondary | ICD-10-CM | POA: Insufficient documentation

## 2013-07-31 DIAGNOSIS — M25561 Pain in right knee: Secondary | ICD-10-CM

## 2013-07-31 DIAGNOSIS — G8929 Other chronic pain: Secondary | ICD-10-CM | POA: Insufficient documentation

## 2013-07-31 DIAGNOSIS — M25569 Pain in unspecified knee: Secondary | ICD-10-CM | POA: Insufficient documentation

## 2013-07-31 MED ORDER — MELOXICAM 7.5 MG PO TABS: 7.5000 mg | ORAL_TABLET | Freq: Every day | ORAL | Status: DC

## 2013-07-31 NOTE — ED Notes (Signed)
Reports hx of right knee pain for extended amount of time, denies new injury. But having pain, swelling and difficulty bearing weight.

## 2013-07-31 NOTE — ED Notes (Signed)
Pt refused knee sleeve, states that they already have one.

## 2013-07-31 NOTE — ED Notes (Signed)
Patient states upset that she couldn't get an immobilizer because she doesn't have insurance.  Patient states she can't afford to go to another doctor for this.

## 2013-07-31 NOTE — Discharge Instructions (Signed)
Wear knee brace you have to help with pain and walking. Use Mobic for pain, do not take additional NSAIDs like ibuprofen or naproxen. You may take tylenol if needed. Use heat to help as well. Discuss with the case worker regarding orthopedic follow up. Perform simple range of motion with your knee throughout the day.    Knee Pain The knee is the complex joint between your thigh and your lower leg. It is made up of bones, tendons, ligaments, and cartilage. The bones that make up the knee are:  The femur in the thigh.  The tibia and fibula in the lower leg.  The patella or kneecap riding in the groove on the lower femur. CAUSES  Knee pain is a common complaint with many causes. A few of these causes are:  Injury, such as:  A ruptured ligament or tendon injury.  Torn cartilage.  Medical conditions, such as:  Gout  Arthritis  Infections  Overuse, over training, or overdoing a physical activity. Knee pain can be minor or severe. Knee pain can accompany debilitating injury. Minor knee problems often respond well to self-care measures or get well on their own. More serious injuries may need medical intervention or even surgery. SYMPTOMS The knee is complex. Symptoms of knee problems can vary widely. Some of the problems are:  Pain with movement and weight bearing.  Swelling and tenderness.  Buckling of the knee.  Inability to straighten or extend your knee.  Your knee locks and you cannot straighten it.  Warmth and redness with pain and fever.  Deformity or dislocation of the kneecap. DIAGNOSIS  Determining what is wrong may be very straight forward such as when there is an injury. It can also be challenging because of the complexity of the knee. Tests to make a diagnosis may include:  Your caregiver taking a history and doing a physical exam.  Routine X-rays can be used to rule out other problems. X-rays will not reveal a cartilage tear. Some injuries of the knee can be  diagnosed by:  Arthroscopy a surgical technique by which a small video camera is inserted through tiny incisions on the sides of the knee. This procedure is used to examine and repair internal knee joint problems. Tiny instruments can be used during arthroscopy to repair the torn knee cartilage (meniscus).  Arthrography is a radiology technique. A contrast liquid is directly injected into the knee joint. Internal structures of the knee joint then become visible on X-ray film.  An MRI scan is a non X-ray radiology procedure in which magnetic fields and a computer produce two- or three-dimensional images of the inside of the knee. Cartilage tears are often visible using an MRI scanner. MRI scans have largely replaced arthrography in diagnosing cartilage tears of the knee.  Blood work.  Examination of the fluid that helps to lubricate the knee joint (synovial fluid). This is done by taking a sample out using a needle and a syringe. TREATMENT The treatment of knee problems depends on the cause. Some of these treatments are:  Depending on the injury, proper casting, splinting, surgery, or physical therapy care will be needed.  Give yourself adequate recovery time. Do not overuse your joints. If you begin to get sore during workout routines, back off. Slow down or do fewer repetitions.  For repetitive activities such as cycling or running, maintain your strength and nutrition.  Alternate muscle groups. For example, if you are a weight lifter, work the upper body on one day and  the lower body the next.  Either tight or weak muscles do not give the proper support for your knee. Tight or weak muscles do not absorb the stress placed on the knee joint. Keep the muscles surrounding the knee strong.  Take care of mechanical problems.  If you have flat feet, orthotics or special shoes may help. See your caregiver if you need help.  Arch supports, sometimes with wedges on the inner or outer aspect of  the heel, can help. These can shift pressure away from the side of the knee most bothered by osteoarthritis.  A brace called an "unloader" brace also may be used to help ease the pressure on the most arthritic side of the knee.  If your caregiver has prescribed crutches, braces, wraps or ice, use as directed. The acronym for this is PRICE. This means protection, rest, ice, compression, and elevation.  Nonsteroidal anti-inflammatory drugs (NSAIDs), can help relieve pain. But if taken immediately after an injury, they may actually increase swelling. Take NSAIDs with food in your stomach. Stop them if you develop stomach problems. Do not take these if you have a history of ulcers, stomach pain, or bleeding from the bowel. Do not take without your caregiver's approval if you have problems with fluid retention, heart failure, or kidney problems.  For ongoing knee problems, physical therapy may be helpful.  Glucosamine and chondroitin are over-the-counter dietary supplements. Both may help relieve the pain of osteoarthritis in the knee. These medicines are different from the usual anti-inflammatory drugs. Glucosamine may decrease the rate of cartilage destruction.  Injections of a corticosteroid drug into your knee joint may help reduce the symptoms of an arthritis flare-up. They may provide pain relief that lasts a few months. You may have to wait a few months between injections. The injections do have a small increased risk of infection, water retention, and elevated blood sugar levels.  Hyaluronic acid injected into damaged joints may ease pain and provide lubrication. These injections may work by reducing inflammation. A series of shots may give relief for as long as 6 months.  Topical painkillers. Applying certain ointments to your skin may help relieve the pain and stiffness of osteoarthritis. Ask your pharmacist for suggestions. Many over the-counter products are approved for temporary relief of  arthritis pain.  In some countries, doctors often prescribe topical NSAIDs for relief of chronic conditions such as arthritis and tendinitis. A review of treatment with NSAID creams found that they worked as well as oral medications but without the serious side effects. PREVENTION  Maintain a healthy weight. Extra pounds put more strain on your joints.  Get strong, stay limber. Weak muscles are a common cause of knee injuries. Stretching is important. Include flexibility exercises in your workouts.  Be smart about exercise. If you have osteoarthritis, chronic knee pain or recurring injuries, you may need to change the way you exercise. This does not mean you have to stop being active. If your knees ache after jogging or playing basketball, consider switching to swimming, water aerobics, or other low-impact activities, at least for a few days a week. Sometimes limiting high-impact activities will provide relief.  Make sure your shoes fit well. Choose footwear that is right for your sport.  Protect your knees. Use the proper gear for knee-sensitive activities. Use kneepads when playing volleyball or laying carpet. Buckle your seat belt every time you drive. Most shattered kneecaps occur in car accidents.  Rest when you are tired. SEEK MEDICAL CARE IF:  You have knee pain that is continual and does not seem to be getting better.  SEEK IMMEDIATE MEDICAL CARE IF:  Your knee joint feels hot to the touch and you have a high fever. MAKE SURE YOU:   Understand these instructions.  Will watch your condition.  Will get help right away if you are not doing well or get worse. Document Released: 11/21/2006 Document Revised: 04/18/2011 Document Reviewed: 11/21/2006 [facility] Patient Information 2015 [place], [place]. This information is not intended to replace advice given to you by your health care provider. Make sure you discuss any questions you have with your health care provider.   Heat  Therapy Heat therapy can help ease achy, tense, stiff, and tight muscles and joints. Heat should not be used on new injuries. Wait at least 48 hours after the injury before using heat therapy. Heat also should not be used for discomfort or pain that occurs right after doing an activity. If you still have pain or stiffness 3 hours after finishing the activity, then heat therapy may be used. PRECAUTIONS  High heat or prolonged exposure to heat can cause burns. Be careful when using heat therapy to avoid burning your skin. If you have any of the following conditions, do not use heat until you have discussed heat therapy with your caregiver:  Poor circulation.  Healing wounds or scarred skin in the area being treated.  Diabetes, heart disease, or high blood pressure.  Numbness of the area being treated.  Unusual swelling of the area being treated.  Active infections.  Blood clots.  Cancer.  Inability to communicate your response to pain. This can include young children and people with dementia. HOME CARE INSTRUCTIONS Moist heat pack  Soak a clean towel in warm water, and squeeze out the extra water. The water temperature should be comfortable to the skin.  Put the warm, wet towel in a plastic bag.  Place a thin, dry towel between your skin and the bag.  Put the heat pack on the area for 5 minutes, and check your skin. Your skin may be pink, but it should not be red.  Leave the heat pack on the area for a total of 15 to 30 minutes.  Repeat this every 2 to 4 hours while awake. Do not use heat while you are sleeping. Warm water bath  Fill a tub with warm water. The water temperature should be comfortable to the skin.  Place the affected body part in the tub.  Soak the area for 20 to 40 minutes.  Repeat as needed. Hot water bottle  Fill the water bottle half full with hot water.  Press out the extra air. Close the cap tightly.  Place a dry towel between your skin and the  bottle.  Put the bottle on the area for 5 minutes, and check your skin. Your skin may be pink, but it should not be red.  Leave the bottle on the area for a total of 15 to 30 minutes.  Repeat this every 2 to 4 hours while awake. Electric heating pad  Place a dry towel between your skin and the heating pad.  Set the heating pad on low heat.  Put the heating pad on the area for 10 minutes, and check your skin. Your skin may be pink, but it should not be red.  Leave the heating pad on the area for a total of 20 to 40 minutes.  Repeat this every 2 to 4 hours while awake.  Do not lie on the heating pad.  Do not fall asleep while using the heating pad.  Do not use the heating pad near water. Contact with water can result in an electrical shock. SEEK MEDICAL CARE IF:  You have blisters, redness, swelling, or numbness.  You have any new problems.  Your problems are getting worse.  You have any questions or concerns. If you develop any problems, stop using heat therapy until you see your caregiver. MAKE SURE YOU:  Understand these instructions.  Will watch your condition.  Will get help right away if you are not doing well or get worse. Document Released: 04/18/2011 Document Reviewed: 04/18/2011 [facility] Patient Information 2015 [place], [place]. This information is not intended to replace advice given to you by your health care provider. Make sure you discuss any questions you have with your health care provider.  Emergency Department Resource Guide 1) Find a Doctor and Pay Out of Pocket Although you won't have to find out who is covered by your insurance plan, it is a good idea to ask around and get recommendations. You will then need to call the office and see if the doctor you have chosen will accept you as a new patient and what types of options they offer for patients who are self-pay. Some doctors offer discounts or will set up payment plans for their patients who do not  have insurance, but you will need to ask so you aren't surprised when you get to your appointment.  2) Contact Your Local Health Department Not all health departments have doctors that can see patients for sick visits, but many do, so it is worth a call to see if yours does. If you don't know where your local health department is, you can check in your phone book. The CDC also has a tool to help you locate your state's health department, and many state websites also have listings of all of their local health departments.  3) Find a Walk-in Clinic If your illness is not likely to be very severe or complicated, you may want to try a walk in clinic. These are popping up all over the country in pharmacies, drugstores, and shopping centers. They're usually staffed by nurse practitioners or physician assistants that have been trained to treat common illnesses and complaints. They're usually fairly quick and inexpensive. However, if you have serious medical issues or chronic medical problems, these are probably not your best option.  No Primary Care Doctor: - Call Health Connect at  [phone] - they can help you locate a primary care doctor that  accepts your insurance, provides certain services, etc. - Physician Referral Service- [phone]  Chronic Pain Problems: Organization         Address  Phone   Notes  [facility]  [phone] Patients need to be referred by their primary care doctor.   Medication Assistance: Organization         Address  Phone   Notes  [facility] [address] [place], [place] [id number] [phone] --Must be a resident of [facility] [place] -- Must have NO insurance coverage whatsoever (no Medicaid/ Medicare, etc.) -- The pt. MUST have a primary care doctor that directs their care regularly and follows them in the community   MedAssist  [phone]   [employer]  [phone]    Agencies that  provide inexpensive medical care: Organization  Address  Phone   Notes  [facility]  [phone]   [facility]    [phone]   [facility] [address] [place], [place] [address] [phone]   [facility] [address] [place] [place] [phone]   Planned Parenthood    [phone]   [facility]    [phone]   [facility]  [address], [place] Phone:  [phone], Fax:  [phone] Hours of Operation:  9 am - 6 pm, M-F.  Also accepts Medicaid/Medicare and self-pay.  [facility]  [address], [place] Phone: [phone], Fax: [phone]. Hours of Operation:  8:30 am - 5:30 pm, M-F.  Also accepts Medicaid and self-pay.  [facility] [place] [address] [place] Phone: [phone]   [facility] [address] Natasha Bence [place], [place] [phone], Ext. [phone] Mondays & Thursdays: 7-9 AM.  First 15 patients are seen on a first come, first serve basis.    Medicaid-accepting [facility] Providers:  Organization         Address  Phone   Notes  [facility] [address], [place] [phone] Also accepts self-pay patients.  [facility] [address] Laurell Josephs [place], [place]  [phone]   [facility] [address], [place] [phone]   [facility] [address], [place] [phone]   Renaye Rakers [address], [place]   [phone] Only accepts [place] Access [place] patients after they have their name applied to their card.   Self-Pay (no insurance) in [facility]:  Organization         Address  Phone   Notes  Sickle Cell Patients, [facility] [address] [place], [place] [phone]   [facility]gent [facility] [address] G[address] [place], [place] [phone]   [facility] [place]  [address], [place] [phone]   [facility]/Dr. Osei-Bonsu  [address], [place] or [address], [place] [phone] Phone number for both [place] and [place] locations is the same.  [facility] and [facility] [address] [place] [phone]   [facility] [address] [place] or [address] [phone]   [facility] [address] [place] [phone], phone; [phone], fax Sees patients 1st and 3rd Saturday of every month.  Must not qualify for public or private insurance (i.e. Medicaid, Medicare, [place] Health Choice, Veterans' Benefits)  Household income should be no more than 200% of the poverty level The clinic cannot treat you if you are pregnant or think you are pregnant  Sexually transmitted diseases are not treated at the clinic.    Dental Care: Organization         Address  Phone  Notes  [facility] [address] [place], [place] [phone] Accepts children up to age 60 who are enrolled in [place] or [place] Health Choice; pregnant women with a Medicaid card; and children who have applied for Medicaid or [place] Health Choice, but were declined, whose parents can pay a reduced fee at time of  service.  [facility]  [address], [place] [phone] Accepts children up to age 74 who are enrolled in [place] or [place] Health Choice; pregnant women with a Medicaid card; and children who have applied for Medicaid or [place] Health Choice, but were declined, whose parents can pay a reduced fee at time of service.  [facility]  [address] [place], [place] [phone] Patients are seen by appointment only. Walk-ins  are not accepted. [facility] will see patients 31 years of age and older. Monday - Tuesday (8am-5pm) Most Wednesdays (8:30-5pm) $30 per visit, cash only  [facility]  [address], [facility] [phone] Patients are seen by appointment only. Walk-ins are not accepted. [facility] will see patients 68 years of age and older. One Wednesday Evening (Monthly: Volunteer Based).  $30 per visit, cash only  [employer]  [phone] for adults; Children under age 29, call Graduate Pediatric Dentistry at [phone]. Children aged 48-14, please call [phone] to request a pediatric application.  Dental services are provided in all areas of dental care including fillings, crowns and bridges, complete and partial dentures, implants, gum treatment, root canals, and extractions. Preventive care is also provided. Treatment is provided to both adults and children. Patients are selected via a lottery and there is often a waiting list.   [facility] [address] [place]  [phone] [email or web address]   [facility] [address] [place], [place] [phone], Ext. [phone] Second and Fourth Thursday of each month, opens at 6:30 AM; Clinic ends at 9 AM.  Patients are seen on a first-come first-served basis, and a limited number are seen during each clinic.   [facility]  [address] Griffins [place], [place] [phone]   Eligibility Requirements You must have lived in [place], [place], or [place] counties for at least the last three months.   You cannot be eligible for state or federal sponsored [place], including [employer], [place], or [employer].   You generally cannot be eligible for healthcare insurance through your employer.    How to apply: Eligibility screenings are held every Tuesday and Wednesday afternoon from 1:00 pm until 4:00 pm. You do not need an appointment  for the interview!  [facility] [address] [place], [place] [phone]   [facility] Health Department  [phone]   [facility] Health Department  [phone]   [facility] Health Department  [phone]    Behavioral Health Resources in the Community: Intensive Outpatient Programs Organization         Address  Phone  Notes  [facility] [address] [place], [place] [phone]   [facility] Out[facility] [address], [place], [place] [phone]   [facility] [address] [place], [place]  [phone]   [facility] Health [address],  [place], [place] [phone] or [phone]   Substance Abuse Resources Organization         Address  Phone  Notes  [facility]  [phone]   [facility]  [phone]   [facility] [place]  [phone]   Floydene Flock  [phone]   Residential & Outpatient Substance Abuse Program  [phone]   Psychological Services Organization         Address  Phone  Notes  [facility]  [phone]   [facility]  [phone]   [facility] Mental Health [address] [place] [phone] or [phone]    Mobile Crisis Teams Organization         Address  Phone  Notes  [facility], Mobile Crisis Care Unit  [phone]   [facility]  [address] [place], [place] [phone]   Doristine Locks [address] [place] [phone]    Self-Help/Support Groups Organization         Address  Phone             Notes  [facility] [place] - variety of support groups  336- [phone] Call for more information  Narcotics Anonymous (NA), [facility] [address], [employer] [place]  2 meetings at this location   Statistician         Address  Phone  Notes  [facility]  Treatment [address],    [place]  [phone]   [facility]  [address] [place] [address], [place], [place] [phone]   [facility] [address] [place], [place] [phone] Admissions: 8am-3pm M-F  [facility] [address],    [place], [place] [phone]   [facility] [address] [place], [place], [place] [phone]   [facility] [address],  [place], [place] [phone]   [facility] - Intensive Outpatient [address] Laurell Joseph[address], [place], [place] [phone]   [facility] ([facility] [address] [place]  [place], [place] [phone] or [phone]   [facility] ([facility]) [address], [place], [place] [phone] Accepts Medicaid  [facility] [address],  [place] [phone] Substance Abuse/Addiction Treatment   [facility] Organization         Address  Phone  Notes  [facility]  [phone]   Angie Fava, PhD [address] Ervin Knack [place], [place]   [phone] or [phone]   [facility]   [address] [place], [place] [phone]   [facility] [address] [place], [place] [phone] Insurance/Medicaid/sponsorship through [facility] [address]                                    [place], [place] [phone] Therapy/tele-psych/case  [facility] [address], [place] [phone]    Dr. Lolly Mustache  [phone]   [facility]  [employer] [facility] Dept. 1) [address] [place], [place] 2) [address], [place] 3)  [address], [place] [phone]  [phone]   [facility] Child Abuse Hotline [phone]) [phone] or [phone] (After Hours)

## 2013-07-31 NOTE — ED Notes (Signed)
Patient refused knee sleeve.  Patient had wanted a knee immobilizer.

## 2013-07-31 NOTE — ED Provider Notes (Signed)
[name]: [id number]     Arrival date & time 07/31/13  1051 History   First MD Initiated Contact with Patient 07/31/13 1127     Chief Complaint  Patient presents with  . Knee Pain     (Consider location/radiation/quality/duration/timing/severity/associated sxs/prior Treatment) HPI Comments: Vicki Pena is a 43 y.o. Female presenting with right knee pain for "years", which has worsened over the last 1 month, and kept her awake all night. States it's moderate in severity, aching, non-radiating, worse with wt bearing, movement and extension, unrelieved by compression ice and NSAIDs although these help some. Endorses some swelling on the medial aspect, but not as bad as she's had in the past. Endorses feeling of instability, and some crunching noises in her knee. 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 Has been golfing recently, which she states is a regular activity for her. No known injuries recently or years ago. States she's had this knee drained in the past, but never had a diagnosis of anything. Denies fever/chills, redness, warmth in the joint, vision changes, or urinary symptoms. Denies that this knee ever feels stiff, does not change over the course of the day. Does not feel worse in the mornings or at night, states it hurts all the time the same. Denies weakness or paresthesias.  Patient is a 43 y.o. female presenting with knee pain. The history is provided by the patient. No language interpreter was used.  Knee Pain Location:  Knee Injury: no   Knee location:  R knee Pain details:    Quality:  Aching   Radiates to:  Does not radiate   Severity:  Moderate   Onset quality:  Gradual   Duration:  1 month   Timing:  Constant   Progression:  Worsening Chronicity:  Chronic (knee pain for years) Dislocation: no   Foreign body present:  No foreign bodies Prior injury to area:  No Relieved by:  Compression and NSAIDs Worsened by:  Activity, extension and bearing  weight Ineffective treatments:  Compression and ice Associated symptoms: decreased ROM and swelling   Associated symptoms: no back pain, no fever, no muscle weakness, no numbness, no stiffness and no tingling     History reviewed. No pertinent past medical history. Past Surgical History  Procedure Laterality Date  . Abdominal hysterectomy    . Tonsillectomy    . Cesarean section    . Foot surgery    . Appendectomy    . Tubal ligation     History reviewed. No pertinent family history. History  Substance Use Topics  . Smoking status: Current Every Day Smoker -- 1.00 packs/day    Types: Cigarettes  . Smokeless tobacco: Never Used  . Alcohol Use: Yes     Comment: occasion   OB History   Grav Para Term Preterm Abortions TAB SAB Ect Mult Living                 Review of Systems  Constitutional: Negative for fever.  Musculoskeletal: Positive for arthralgias (R knee) and joint swelling (R knee). Negative for back pain, myalgias and stiffness.  Skin: Negative for color change and rash.  Neurological: Negative for weakness and numbness.      Allergies  Amoxicillin  Home Medications   Prior to Admission medications   Medication Sig Start Date End Date Taking? Authorizing Fredia Chittenden  naproxen sodium (ANAPROX) 220 MG tablet Take 440 mg by mouth 2 (two) times daily as needed (pain).   Yes  Historical Rudine Rieger, MD  meloxicam (MOBIC) 7.5 MG tablet Take 1 tablet (7.5 mg total) by mouth daily. If your pain is unrelieved, you may take one (1) additional tablet each day. Take with food. 07/31/13   Mercedes Strupp Camprubi-Soms, PA-C   BP 123/79  Pulse 88  Temp(Src) 98.5 F (36.9 C) (Oral)  Resp 18  Ht 5\' 4"  (1.626 m)  Wt 170 lb (77.111 kg)  BMI 29.17 kg/m2  SpO2 98%  LMP 02/07/1998 Physical Exam  Nursing note and vitals reviewed. Constitutional: She is oriented to person, place, and time. She appears well-developed and well-nourished. No distress.  HENT:  Head: Normocephalic.   Eyes: Conjunctivae and EOM are normal.  Neck: Normal range of motion.  Cardiovascular: Normal rate.   Pulmonary/Chest: Effort normal.  Musculoskeletal:       Right knee: She exhibits decreased range of motion (secondary to pain), swelling (medial joint line) and MCL laxity. She exhibits normal alignment, no bony tenderness and normal meniscus. Tenderness found. Medial joint line tenderness noted.  R knee TTP in medial joint line, with mild swelling noted in the area. Decreased ROM secondary to pain, able to complete full flexion but only ~80% extension. Passive ROM able to be performed fully in extension and flexion. Some slight MCL laxity noted with valgus stress applied but no gross instability or deformity, no LCL laxity noted. No malalignment of patella, no crepitus or baker's cyst. Patellar tracking WNL. No bony TTP, negative anterior and posterior drawer test. McMurray's unable to be performed due to patient discomfort. No warmth or erythema. Strength 5/5 with resistance.   Neurological: She is alert and oriented to person, place, and time. She has normal strength. No sensory deficit.  Strength 5/5 bilaterally. Sensation grossly intact.  Skin: Skin is warm, dry and intact. No erythema.  No erythema noted in joint  Psychiatric: She has a normal mood and affect.    ED Course  Procedures (including critical care time) Labs Review Labs Reviewed - No data to display  Imaging Review No results found.   EKG Interpretation None      MDM   Final diagnoses:  Knee pain, chronic, right    Vicki Pena is a 43 y.o. female with ongoing R knee pain and intermittent swelling. Has had this for years, has gotten worse over the last month, and today she states it's to the point that she can't handle it. Exam only demonstrating mild laxity in MCL but no gross deformity or concern for recent tear, possible old injury. Extensively discussed options of aspiration and kenalog injection which she  refused. Discussed that this knee pain needs to be evaluated by an orthopedic doctor, who can obtain MRI as an outpt if chronic meniscal or ligamentous injury is the source of the pain. Due to non-acute nature of complaint, no concern for fracture or acute injury. No concern for septic joint or gout. Pt given options for knee brace/sleeve which she declined, states she wants more support in the form of immobilization. Discussed at length the downside to this, and complications that will arise. Offered pain medication, Mobic is on the $4 list and therefore pt agreed to use this. Case worker called, and will follow up with pt to get ortho follow up, as pt has no insurance and wants to find a doctor willing to help with payment plans. I explained the diagnosis and have given explicit precautions to return to the ER including for any other new or worsening symptoms. The patient  understands and accepts the medical plan as it's been dictated and I have answered their questions. Discharge instructions concerning home care and prescriptions have been given. The patient is STABLE and is discharged to home in good condition.  BP 123/79  Pulse 88  Temp(Src) 98.5 F (36.9 C) (Oral)  Resp 18  Ht 5\' 4"  (1.626 m)  Wt 170 lb (77.111 kg)  BMI 29.17 kg/m2  SpO2 98%  LMP 02/07/1998    [place] Camprubi-Soms, PA-C 07/31/13 1215

## 2013-08-02 NOTE — ED Provider Notes (Signed)
Medical screening examination/treatment/procedure(s) were performed by non-physician practitioner and as supervising physician I was immediately available for consultation/collaboration.   EKG Interpretation None        Laray AngerKathleen M McManus, DO 08/02/13 [phone]

## 2013-08-05 ENCOUNTER — Other Ambulatory Visit (HOSPITAL_COMMUNITY): Payer: Self-pay | Admitting: Orthopaedic Surgery

## 2013-08-05 DIAGNOSIS — M25561 Pain in right knee: Secondary | ICD-10-CM

## 2013-08-06 NOTE — Discharge Planning (Signed)
[facility] Community Liaison was not able to see patient, [facility] orange card information and primary care resource guide will be mailed to the address listed °

## 2013-08-21 ENCOUNTER — Encounter: Payer: Self-pay | Admitting: Internal Medicine

## 2013-08-21 ENCOUNTER — Ambulatory Visit: Payer: Self-pay | Attending: Internal Medicine | Admitting: Internal Medicine

## 2013-08-21 ENCOUNTER — Ambulatory Visit: Payer: Self-pay

## 2013-08-21 VITALS — BP 131/85 | HR 84 | Temp 98.1°F | Resp 16 | Ht 64.0 in | Wt 176.0 lb

## 2013-08-21 DIAGNOSIS — F17200 Nicotine dependence, unspecified, uncomplicated: Secondary | ICD-10-CM | POA: Insufficient documentation

## 2013-08-21 DIAGNOSIS — K5900 Constipation, unspecified: Secondary | ICD-10-CM

## 2013-08-21 DIAGNOSIS — L729 Follicular cyst of the skin and subcutaneous tissue, unspecified: Secondary | ICD-10-CM

## 2013-08-21 DIAGNOSIS — L723 Sebaceous cyst: Secondary | ICD-10-CM

## 2013-08-21 MED ORDER — POLYETHYLENE GLYCOL 3350 17 GM/SCOOP PO POWD: 17.0000 g | Freq: Every day | ORAL | Status: DC

## 2013-08-21 NOTE — Progress Notes (Signed)
Patient ID: Vicki Pena, female   DOB: [birth year], 43 y.o.   MRN: [id number]  WUJ:[id number]  [id number]  DOB - March 20, 1970  CC:  Chief Complaint  Patient presents with  . Establish Care       HPI: Vicki Pena is a 43 y.o. female here today to establish medical care.  Patient reports that she was seen in the ER 2 weeks ago for right knee pain.  Patient denies injury but this pain has been present for the past 10 years.  Patient states that she feels a popping and locking sensations.  Patient states that she seen a orthopedic doctor the next day after leaving the ED, and was given a corticosteroid injection and a brace.  Patient has a MRI scheduled but is awaiting orange card.  Patient also c/o right shoulder pain that has become increasingly severe in the past three years..    Patient has No headache, No chest pain, No abdominal pain - No Nausea, No new weakness tingling or numbness, No Cough - SOB.  Allergies  Allergen Reactions  . Amoxicillin Anaphylaxis and Swelling    Face/neck swelling fever lips turned blue   History reviewed. No pertinent past medical history. Current Outpatient Prescriptions on File Prior to Visit  Medication Sig Dispense Refill  . meloxicam (MOBIC) 7.5 MG tablet Take 1 tablet (7.5 mg total) by mouth daily. If your pain is unrelieved, you may take one (1) additional tablet each day. Take with food.  30 tablet  0  . naproxen sodium (ANAPROX) 220 MG tablet Take 440 mg by mouth 2 (two) times daily as needed (pain).       No current facility-administered medications on file prior to visit.   Family History  Problem Relation Age of Onset  . Diabetes Mother   . COPD Mother   . Heart disease Father   . Heart disease Brother   . Drug abuse Brother    History   Social History  . Marital Status: Single    Spouse Name: N/A    Number of Children: N/A  . Years of Education: N/A   Occupational History  . Not on file.   Social History Main Topics  .  Smoking status: Current Every Day Smoker -- 1.00 packs/day    Types: Cigarettes  . Smokeless tobacco: Never Used  . Alcohol Use: Yes     Comment: occasion  . Drug Use: No  . Sexual Activity: Not on file   Other Topics Concern  . Not on file   Social History Narrative  . No narrative on file   Review of Systems  Constitutional: Negative.   HENT: Negative.   Eyes: Negative.   Respiratory: Negative.   Cardiovascular: Negative.   Gastrointestinal: Negative.   Genitourinary: Negative.   Musculoskeletal: Positive for joint pain.  Skin:       Has cyst on right buttocks-present for past 5 years   Neurological: Negative.   Endo/Heme/Allergies: Negative.   Psychiatric/Behavioral: Negative.       Objective:   Filed Vitals:   08/21/13 1547  BP: 131/85  Pulse: 84  Temp: 98.1 F (36.7 C)  Resp: 16   Physical Exam  Vitals reviewed. Constitutional: She is oriented to person, place, and time. She appears well-nourished. No distress.  HENT:  Right Ear: External ear normal.  Left Ear: External ear normal.  Mouth/Throat: Oropharynx is clear and moist.  Eyes: EOM are normal. Pupils are equal, round, and reactive to light.  Neck:  Normal range of motion. No thyromegaly present.  Cardiovascular: Normal rate, regular rhythm and normal heart sounds.   Pulmonary/Chest: Effort normal and breath sounds normal.  Abdominal: Soft. Bowel sounds are normal.  Musculoskeletal: Normal range of motion. She exhibits no edema and no tenderness.  Neurological: She is alert and oriented to person, place, and time. She has normal reflexes.  Skin: Skin is warm and dry.  Psychiatric: She has a normal mood and affect. Thought content normal.     Lab Results  Component Value Date   WBC 22.6* 07/22/2012   HGB 13.2 07/22/2012   HCT 39.5 07/22/2012   MCV 88.0 07/22/2012   PLT 279 07/22/2012   Lab Results  Component Value Date   CREATININE 0.53 07/22/2012   BUN 15 07/22/2012   NA 135 07/22/2012   K  3.5 07/22/2012   CL 103 07/22/2012   CO2 23 07/22/2012    No results found for this basename: HGBA1C   Lipid Panel  No results found for this basename: chol, trig, hdl, cholhdl, vldl, ldlcalc       Assessment and plan:   [name] was seen today for establish care.  Diagnoses and associated orders for this visit:  Unspecified constipation - polyethylene glycol powder (GLYCOLAX/MIRALAX) powder; Take 17 g by mouth daily.  Cyst of buttocks - Ambulatory referral to General Surgery   Return in about 1 week (around 08/28/2013) for Lab Visit.  The patient was given clear instructions to go to ER or return to medical center if symptoms don't improve, worsen or new problems develop. The patient verbalized understanding. The patient was told to call to get lab results if they haven't heard anything in the next week.     Holland CommonsKECK, VALERIE, NP-C [facility] [phone] 09/01/2013, 8:26 PM

## 2013-08-21 NOTE — Progress Notes (Signed)
[facility] 2 weeks ago pt went to the ED because she was unable to move her right leg.

## 2013-08-28 ENCOUNTER — Ambulatory Visit: Payer: Self-pay | Attending: Internal Medicine

## 2013-08-28 DIAGNOSIS — Z0000 Encounter for general adult medical examination without abnormal findings: Secondary | ICD-10-CM

## 2013-08-28 LAB — LIPID PANEL
Cholesterol: 157 mg/dL (ref 0–200)
HDL: 40 mg/dL (ref >39)
LDL CALC: 103 mg/dL — AB (ref 0–99)
Total CHOL/HDL Ratio: 3.9 Ratio
Triglycerides: 68 mg/dL (ref <150)
VLDL: 14 mg/dL (ref 0–40)

## 2013-09-05 ENCOUNTER — Telehealth: Payer: Self-pay | Admitting: Internal Medicine

## 2013-09-05 NOTE — Telephone Encounter (Signed)
Pt. Would like to know the Lab results from 7/22 Please f/u with Pt.

## 2013-09-09 NOTE — Telephone Encounter (Signed)
Provider reviewed lipid panel and stated that labs are normal. Attempted to reach patient. Message left on patient's VM to return call to discuss lab results.

## 2013-09-10 NOTE — Telephone Encounter (Signed)
Patient informed lipid panel is normal per provider.

## 2013-09-10 NOTE — Telephone Encounter (Signed)
Pt returning call regarding results.  

## 2014-03-13 ENCOUNTER — Encounter (HOSPITAL_COMMUNITY): Payer: Self-pay | Admitting: Emergency Medicine

## 2014-03-13 ENCOUNTER — Emergency Department (HOSPITAL_COMMUNITY)
Admission: EM | Admit: 2014-03-13 | Discharge: 2014-03-13 | Disposition: A | Discharge status: Home / Self Care | Payer: Self-pay | Attending: Emergency Medicine | Admitting: Emergency Medicine

## 2014-03-13 ENCOUNTER — Emergency Department (HOSPITAL_COMMUNITY): Payer: Self-pay

## 2014-03-13 DIAGNOSIS — Z79899 Other long term (current) drug therapy: Secondary | ICD-10-CM | POA: Insufficient documentation

## 2014-03-13 DIAGNOSIS — Z720 Tobacco use: Secondary | ICD-10-CM | POA: Insufficient documentation

## 2014-03-13 DIAGNOSIS — J209 Acute bronchitis, unspecified: Secondary | ICD-10-CM | POA: Insufficient documentation

## 2014-03-13 DIAGNOSIS — Z791 Long term (current) use of non-steroidal anti-inflammatories (NSAID): Secondary | ICD-10-CM | POA: Insufficient documentation

## 2014-03-13 DIAGNOSIS — R079 Chest pain, unspecified: Secondary | ICD-10-CM | POA: Insufficient documentation

## 2014-03-13 DIAGNOSIS — Z880 Allergy status to penicillin: Secondary | ICD-10-CM | POA: Insufficient documentation

## 2014-03-13 LAB — CBC WITH DIFFERENTIAL/PLATELET
BASOS ABS: 0 10*3/uL (ref 0.0–0.1)
Basophils Relative: 0 % (ref 0–1)
EOS PCT: 2 % (ref 0–5)
Eosinophils Absolute: 0.2 10*3/uL (ref 0.0–0.7)
HEMATOCRIT: 43.1 % (ref 36.0–46.0)
HEMOGLOBIN: 14.4 g/dL (ref 12.0–15.0)
LYMPHS ABS: 3.3 10*3/uL (ref 0.7–4.0)
Lymphocytes Relative: 31 % (ref 12–46)
MCH: 30.7 pg (ref 26.0–34.0)
MCHC: 33.4 g/dL (ref 30.0–36.0)
MCV: 91.9 fL (ref 78.0–100.0)
Monocytes Absolute: 0.4 10*3/uL (ref 0.1–1.0)
Monocytes Relative: 4 % (ref 3–12)
NEUTROS ABS: 6.7 10*3/uL (ref 1.7–7.7)
Neutrophils Relative %: 63 % (ref 43–77)
PLATELETS: 352 10*3/uL (ref 150–400)
RBC: 4.69 MIL/uL (ref 3.87–5.11)
RDW: 12.8 % (ref 11.5–15.5)
WBC: 10.7 10*3/uL — AB (ref 4.0–10.5)

## 2014-03-13 LAB — BASIC METABOLIC PANEL
Anion gap: 8 (ref 5–15)
BUN: 8 mg/dL (ref 6–23)
CO2: 24 mmol/L (ref 19–32)
CREATININE: 0.7 mg/dL (ref 0.50–1.10)
Calcium: 9.2 mg/dL (ref 8.4–10.5)
Chloride: 106 mmol/L (ref 96–112)
GFR calc non Af Amer: >90 mL/min (ref >90)
Glucose, Bld: 100 mg/dL — ABNORMAL HIGH (ref 70–99)
Potassium: 3.5 mmol/L (ref 3.5–5.1)
SODIUM: 138 mmol/L (ref 135–145)

## 2014-03-13 LAB — I-STAT TROPONIN, ED: Troponin i, poc: 0 ng/mL (ref 0.00–0.08)

## 2014-03-13 MED ORDER — PREDNISONE 20 MG PO TABS: 60.0000 mg | ORAL_TABLET | Freq: Every day | ORAL | Status: DC

## 2014-03-13 MED ORDER — ALBUTEROL SULFATE HFA 108 (90 BASE) MCG/ACT IN AERS
2.0000 | INHALATION_SPRAY | RESPIRATORY_TRACT | Status: DC | PRN
  Administered 2014-03-13: 2 via RESPIRATORY_TRACT
  Filled 2014-03-13: qty 6.7

## 2014-03-13 MED ORDER — PREDNISONE 20 MG PO TABS
60.0000 mg | ORAL_TABLET | Freq: Once | ORAL | Status: AC
  Administered 2014-03-13: 60 mg via ORAL
  Filled 2014-03-13: qty 3

## 2014-03-13 MED ORDER — IPRATROPIUM-ALBUTEROL 0.5-2.5 (3) MG/3ML IN SOLN
3.0000 mL | Freq: Once | RESPIRATORY_TRACT | Status: AC
  Administered 2014-03-13: 3 mL via RESPIRATORY_TRACT
  Filled 2014-03-13: qty 3

## 2014-03-13 NOTE — ED Notes (Signed)
Pt c/o left sided CP x 4 days intermittently; pt denies SOB

## 2014-03-13 NOTE — Discharge Instructions (Signed)
Take ibuprofen or naproxen as needed for pain.  Acute Bronchitis Bronchitis is inflammation of the airways that extend from the windpipe into the lungs (bronchi). The inflammation often causes mucus to develop. This leads to a cough, which is the most common symptom of bronchitis.  In acute bronchitis, the condition usually develops suddenly and goes away over time, usually in a couple weeks. Smoking, allergies, and asthma can make bronchitis worse. Repeated episodes of bronchitis may cause further lung problems.  CAUSES Acute bronchitis is most often caused by the same virus that causes a cold. The virus can spread from person to person (contagious) through coughing, sneezing, and touching contaminated objects. SIGNS AND SYMPTOMS   Cough.   Fever.   Coughing up mucus.   Body aches.   Chest congestion.   Chills.   Shortness of breath.   Sore throat.  DIAGNOSIS  Acute bronchitis is usually diagnosed through a physical exam. Your health care provider will also ask you questions about your medical history. Tests, such as chest X-rays, are sometimes done to rule out other conditions.  TREATMENT  Acute bronchitis usually goes away in a couple weeks. Oftentimes, no medical treatment is necessary. Medicines are sometimes given for relief of fever or cough. Antibiotic medicines are usually not needed but may be prescribed in certain situations. In some cases, an inhaler may be recommended to help reduce shortness of breath and control the cough. A cool mist vaporizer may also be used to help thin bronchial secretions and make it easier to clear the chest.  HOME CARE INSTRUCTIONS  Get plenty of rest.   Drink enough fluids to keep your urine clear or pale yellow (unless you have a medical condition that requires fluid restriction). Increasing fluids may help thin your respiratory secretions (sputum) and reduce chest congestion, and it will prevent dehydration.   Take medicines only  as directed by your health care provider.  If you were prescribed an antibiotic medicine, finish it all even if you start to feel better.  Avoid smoking and secondhand smoke. Exposure to cigarette smoke or irritating chemicals will make bronchitis worse. If you are a smoker, consider using nicotine gum or skin patches to help control withdrawal symptoms. Quitting smoking will help your lungs heal faster.   Reduce the chances of another bout of acute bronchitis by washing your hands frequently, avoiding people with cold symptoms, and trying not to touch your hands to your mouth, nose, or eyes.   Keep all follow-up visits as directed by your health care provider.  SEEK MEDICAL CARE IF: Your symptoms do not improve after 1 week of treatment.  SEEK IMMEDIATE MEDICAL CARE IF:  You develop an increased fever or chills.   You have chest pain.   You have severe shortness of breath.  You have bloody sputum.   You develop dehydration.  You faint or repeatedly feel like you are going to pass out.  You develop repeated vomiting.  You develop a severe headache. MAKE SURE YOU:   Understand these instructions.  Will watch your condition.  Will get help right away if you are not doing well or get worse. Document Released: 03/03/2004 Document Revised: 06/10/2013 Document Reviewed: 07/17/2012 [facility] Patient Information 2015 [place], [place]. This information is not intended to replace advice given to you by your health care provider. Make sure you discuss any questions you have with your health care provider.   Chest Pain (Nonspecific) It is often hard to give a specific diagnosis for  the cause of chest pain. There is always a chance that your pain could be related to something serious, such as a heart attack or a blood clot in the lungs. You need to follow up with your health care provider for further evaluation. CAUSES   Heartburn.  Pneumonia or bronchitis.  Anxiety or  stress.  Inflammation around your heart (pericarditis) or lung (pleuritis or pleurisy).  A blood clot in the lung.  A collapsed lung (pneumothorax). It can develop suddenly on its own (spontaneous pneumothorax) or from trauma to the chest.  Shingles infection (herpes zoster virus). The chest wall is composed of bones, muscles, and cartilage. Any of these can be the source of the pain.  The bones can be bruised by injury.  The muscles or cartilage can be strained by coughing or overwork.  The cartilage can be affected by inflammation and become sore (costochondritis). DIAGNOSIS  Lab tests or other studies may be needed to find the cause of your pain. Your health care provider may have you take a test called an ambulatory electrocardiogram (ECG). An ECG records your heartbeat patterns over a 24-hour period. You may also have other tests, such as:  Transthoracic echocardiogram (TTE). During echocardiography, sound waves are used to evaluate how blood flows through your heart.  Transesophageal echocardiogram (TEE).  Cardiac monitoring. This allows your health care provider to monitor your heart rate and rhythm in real time.  Holter monitor. This is a portable device that records your heartbeat and can help diagnose heart arrhythmias. It allows your health care provider to track your heart activity for several days, if needed.  Stress tests by exercise or by giving medicine that makes the heart beat faster. TREATMENT   Treatment depends on what may be causing your chest pain. Treatment may include:  Acid blockers for heartburn.  Anti-inflammatory medicine.  Pain medicine for inflammatory conditions.  Antibiotics if an infection is present.  You may be advised to change lifestyle habits. This includes stopping smoking and avoiding alcohol, caffeine, and chocolate.  You may be advised to keep your head raised (elevated) when sleeping. This reduces the chance of acid going backward  from your stomach into your esophagus. Most of the time, nonspecific chest pain will improve within 2-3 days with rest and mild pain medicine.  HOME CARE INSTRUCTIONS   If antibiotics were prescribed, take them as directed. Finish them even if you start to feel better.  For the next few days, avoid physical activities that bring on chest pain. Continue physical activities as directed.  Do not use any tobacco products, including cigarettes, chewing tobacco, or electronic cigarettes.  Avoid drinking alcohol.  Only take medicine as directed by your health care provider.  Follow your health care provider's suggestions for further testing if your chest pain does not go away.  Keep any follow-up appointments you made. If you do not go to an appointment, you could develop lasting (chronic) problems with pain. If there is any problem keeping an appointment, call to reschedule. SEEK MEDICAL CARE IF:   Your chest pain does not go away, even after treatment.  You have a rash with blisters on your chest.  You have a fever. SEEK IMMEDIATE MEDICAL CARE IF:   You have increased chest pain or pain that spreads to your arm, neck, jaw, back, or abdomen.  You have shortness of breath.  You have an increasing cough, or you cough up blood.  You have severe back or abdominal pain.  You  feel nauseous or vomit.  You have severe weakness.  You faint.  You have chills. This is an emergency. Do not wait to see if the pain will go away. Get medical help at once. Call your local emergency services (911 in [place] Do not drive yourself to the hospital. MAKE SURE YOU:   Understand these instructions.  Will watch your condition.  Will get help right away if you are not doing well or get worse. Document Released: 11/03/2004 Document Revised: 01/29/2013 Document Reviewed: 08/30/2007 [facility] Patient Information 2015 [place], [place]. This information is not intended to replace advice given to you by  your health care provider. Make sure you discuss any questions you have with your health care provider.  Smoking Hazards Smoking cigarettes is extremely bad for your health. Tobacco smoke has over 200 known poisons in it. It contains the poisonous gases nitrogen oxide and carbon monoxide. There are over 60 chemicals in tobacco smoke that cause cancer. Some of the chemicals found in cigarette smoke include:   Cyanide.   Benzene.   Formaldehyde.   Methanol (wood alcohol).   Acetylene (fuel used in welding torches).   Ammonia.  Even smoking lightly shortens your life expectancy by several years. You can greatly reduce the risk of medical problems for you and your family by stopping now. Smoking is the most preventable cause of death and disease in our society. Within days of quitting smoking, your circulation improves, you decrease the risk of having a heart attack, and your lung capacity improves. There may be some increased phlegm in the first few days after quitting, and it may take months for your lungs to clear up completely. Quitting for 10 years reduces your risk of developing lung cancer to almost that of a nonsmoker.  WHAT ARE THE RISKS OF SMOKING? Cigarette smokers have an increased risk of many serious medical problems, including:  Lung cancer.   Lung disease (such as pneumonia, bronchitis, and emphysema).   Heart attack and chest pain due to the heart not getting enough oxygen (angina).   Heart disease and peripheral blood vessel disease.   Hypertension.   Stroke.   Oral cancer (cancer of the lip, mouth, or voice box).   Bladder cancer.   Pancreatic cancer.   Cervical cancer.   Pregnancy complications, including premature birth.   Stillbirths and smaller newborn babies, birth defects, and genetic damage to sperm.   Early menopause.   Lower estrogen level for women.   Infertility.   Facial wrinkles.   Blindness.   Increased risk of  broken bones (fractures).   Senile dementia.   Stomach ulcers and internal bleeding.   Delayed wound healing and increased risk of complications during surgery. Because of secondhand smoke exposure, children of smokers have an increased risk of the following:   Sudden infant death syndrome (SIDS).   Respiratory infections.   Lung cancer.   Heart disease.   Ear infections.  WHY IS SMOKING ADDICTIVE? Nicotine is the chemical agent in tobacco that is capable of causing addiction or dependence. When you smoke and inhale, nicotine is absorbed rapidly into the bloodstream through your lungs. Both inhaled and noninhaled nicotine may be addictive.  WHAT ARE THE BENEFITS OF QUITTING?  There are many health benefits to quitting smoking. Some are:   The likelihood of developing cancer and heart disease decreases. Health improvements are seen almost immediately.   Blood pressure, pulse rate, and breathing patterns start returning to normal soon after quitting.   People  who quit may see an improvement in their overall quality of life.  HOW DO YOU QUIT SMOKING? Smoking is an addiction with both physical and psychological effects, and longtime habits can be hard to change. Your health care provider can recommend:  Programs and community resources, which may include group support, education, or therapy.  Replacement products, such as patches, gum, and nasal sprays. Use these products only as directed. Do not replace cigarette smoking with electronic cigarettes (commonly called e-cigarettes). The safety of e-cigarettes is unknown, and some may contain harmful chemicals. FOR MORE INFORMATION  American Lung Association: www.lung.org  American Cancer Society: www.cancer.org Document Released: 03/03/2004 Document Revised: 11/14/2012 Document Reviewed: 07/16/2012 [facility] Patient Information 2015 [place], [place]. This information is not intended to replace advice given to you by your  health care provider. Make sure you discuss any questions you have with your health care provider.  Albuterol inhalation aerosol What is this medicine? ALBUTEROL (al Gaspar Bidding) is a bronchodilator. It helps open up the airways in your lungs to make it easier to breathe. This medicine is used to treat and to prevent bronchospasm. This medicine may be used for other purposes; ask your health care provider or pharmacist if you have questions. COMMON BRAND NAME(S): Proair HFA, Proventil, Proventil HFA, Respirol, Ventolin, Ventolin HFA What should I tell my health care provider before I take this medicine? They need to know if you have any of the following conditions: -diabetes -heart disease or irregular heartbeat -high blood pressure -pheochromocytoma -seizures -thyroid disease -an unusual or allergic reaction to albuterol, levalbuterol, sulfites, other medicines, foods, dyes, or preservatives -pregnant or trying to get pregnant -breast-feeding How should I use this medicine? This medicine is for inhalation through the mouth. Follow the directions on your prescription label. Take your medicine at regular intervals. Do not use more often than directed. Make sure that you are using your inhaler correctly. Ask you doctor or health care provider if you have any questions. Talk to your pediatrician regarding the use of this medicine in children. Special care may be needed. Overdosage: If you think you have taken too much of this medicine contact a poison control center or emergency room at once. NOTE: This medicine is only for you. Do not share this medicine with others. What if I miss a dose? If you miss a dose, use it as soon as you can. If it is almost time for your next dose, use only that dose. Do not use double or extra doses. What may interact with this medicine? -anti-infectives like chloroquine and pentamidine -caffeine -cisapride -diuretics -medicines for colds -medicines for  depression or for emotional or psychotic conditions -medicines for weight loss including some herbal products -methadone -some antibiotics like clarithromycin, erythromycin, levofloxacin, and linezolid -some heart medicines -steroid hormones like dexamethasone, cortisone, hydrocortisone -theophylline -thyroid hormones This list may not describe all possible interactions. Give your health care provider a list of all the medicines, herbs, non-prescription drugs, or dietary supplements you use. Also tell them if you smoke, drink alcohol, or use illegal drugs. Some items may interact with your medicine. What should I watch for while using this medicine? Tell your doctor or health care professional if your symptoms do not improve. Do not use extra albuterol. If your asthma or bronchitis gets worse while you are using this medicine, call your doctor right away. If your mouth gets dry try chewing sugarless gum or sucking hard candy. Drink water as directed. What side effects may I notice  from receiving this medicine? Side effects that you should report to your doctor or health care professional as soon as possible: -allergic reactions like skin rash, itching or hives, swelling of the face, lips, or tongue -breathing problems -chest pain -feeling faint or lightheaded, falls -high blood pressure -irregular heartbeat -fever -muscle cramps or weakness -pain, tingling, numbness in the hands or feet -vomiting Side effects that usually do not require medical attention (report to your doctor or health care professional if they continue or are bothersome): -cough -difficulty sleeping -headache -nervousness or trembling -stomach upset -stuffy or runny nose -throat irritation -unusual taste This list may not describe all possible side effects. Call your doctor for medical advice about side effects. You may report side effects to FDA at 1-800-FDA-1088. Where should I keep my medicine? Keep out of the  reach of children. Store at room temperature between 15 and 30 degrees C (59 and 86 degrees F). The contents are under pressure and may burst when exposed to heat or flame. Do not freeze. This medicine does not work as well if it is too cold. Throw away any unused medicine after the expiration date. Inhalers need to be thrown away after the labeled number of puffs have been used or by the expiration date; whichever comes first. Ventolin HFA should be thrown away 12 months after removing from foil pouch. Check the instructions that come with your medicine. NOTE: This sheet is a summary. It may not cover all possible information. If you have questions about this medicine, talk to your doctor, pharmacist, or health care provider.  2015, [employer] Standard. (2012-07-12 10:57:17)  Prednisone tablets What is this medicine? PREDNISONE (PRED ni sone) is a corticosteroid. It is commonly used to treat inflammation of the skin, joints, lungs, and other organs. Common conditions treated include asthma, allergies, and arthritis. It is also used for other conditions, such as blood disorders and diseases of the adrenal glands. This medicine may be used for other purposes; ask your health care provider or pharmacist if you have questions. COMMON BRAND NAME(S): Deltasone, Predone, Sterapred, Sterapred DS What should I tell my health care provider before I take this medicine? They need to know if you have any of these conditions: -Cushing's syndrome -diabetes -glaucoma -heart disease -high blood pressure -infection (especially a virus infection such as chickenpox, cold sores, or herpes) -kidney disease -liver disease -mental illness -myasthenia gravis -osteoporosis -seizures -stomach or intestine problems -thyroid disease -an unusual or allergic reaction to lactose, prednisone, other medicines, foods, dyes, or preservatives -pregnant or trying to get pregnant -breast-feeding How should I use this  medicine? Take this medicine by mouth with a glass of water. Follow the directions on the prescription label. Take this medicine with food. If you are taking this medicine once a day, take it in the morning. Do not take more medicine than you are told to take. Do not suddenly stop taking your medicine because you may develop a severe reaction. Your doctor will tell you how much medicine to take. If your doctor wants you to stop the medicine, the dose may be slowly lowered over time to avoid any side effects. Talk to your pediatrician regarding the use of this medicine in children. Special care may be needed. Overdosage: If you think you have taken too much of this medicine contact a poison control center or emergency room at once. NOTE: This medicine is only for you. Do not share this medicine with others. What if I miss a dose? If  you miss a dose, take it as soon as you can. If it is almost time for your next dose, talk to your doctor or health care professional. You may need to miss a dose or take an extra dose. Do not take double or extra doses without advice. What may interact with this medicine? Do not take this medicine with any of the following medications: -metyrapone -mifepristone This medicine may also interact with the following medications: -aminoglutethimide -amphotericin B -aspirin and aspirin-like medicines -barbiturates -certain medicines for diabetes, like glipizide or glyburide -cholestyramine -cholinesterase inhibitors -cyclosporine -digoxin -diuretics -ephedrine -female hormones, like estrogens and birth control pills -isoniazid -ketoconazole -NSAIDS, medicines for pain and inflammation, like ibuprofen or naproxen -phenytoin -rifampin -toxoids -vaccines -warfarin This list may not describe all possible interactions. Give your health care provider a list of all the medicines, herbs, non-prescription drugs, or dietary supplements you use. Also tell them if you smoke,  drink alcohol, or use illegal drugs. Some items may interact with your medicine. What should I watch for while using this medicine? Visit your doctor or health care professional for regular checks on your progress. If you are taking this medicine over a prolonged period, carry an identification card with your name and address, the type and dose of your medicine, and your doctor's name and address. This medicine may increase your risk of getting an infection. Tell your doctor or health care professional if you are around anyone with measles or chickenpox, or if you develop sores or blisters that do not heal properly. If you are going to have surgery, tell your doctor or health care professional that you have taken this medicine within the last twelve months. Ask your doctor or health care professional about your diet. You may need to lower the amount of salt you eat. This medicine may affect blood sugar levels. If you have diabetes, check with your doctor or health care professional before you change your diet or the dose of your diabetic medicine. What side effects may I notice from receiving this medicine? Side effects that you should report to your doctor or health care professional as soon as possible: -allergic reactions like skin rash, itching or hives, swelling of the face, lips, or tongue -changes in emotions or moods -changes in vision -depressed mood -eye pain -fever or chills, cough, sore throat, pain or difficulty passing urine -increased thirst -swelling of ankles, feet Side effects that usually do not require medical attention (report to your doctor or health care professional if they continue or are bothersome): -confusion, excitement, restlessness -headache -nausea, vomiting -skin problems, acne, thin and shiny skin -trouble sleeping -weight gain This list may not describe all possible side effects. Call your doctor for medical advice about side effects. You may report side  effects to FDA at 1-800-FDA-1088. Where should I keep my medicine? Keep out of the reach of children. Store at room temperature between 15 and 30 degrees C (59 and 86 degrees F). Protect from light. Keep container tightly closed. Throw away any unused medicine after the expiration date. NOTE: This sheet is a summary. It may not cover all possible information. If you have questions about this medicine, talk to your doctor, pharmacist, or health care provider.  2015, [employer] Standard. (2010-09-09 10:57:14)

## 2014-03-13 NOTE — ED Provider Notes (Signed)
[name]: [id number]     Arrival date & time 03/13/14  1451 History   First MD Initiated Contact with Patient 03/13/14 1822     Chief Complaint  Patient presents with  . Chest Pain     (Consider location/radiation/quality/duration/timing/severity/associated sxs/prior Treatment) Patient is a 44 y.o. female presenting with chest pain. The history is provided by the patient.  Chest Pain She has been having episodes of chest pains for the last 4 days. Pain is sharp and will last as long as a minute before resolving but then will recur within 30-60 minutes. Pain is in the midst and lower sternal area but today has been radiating around to both sides of the chest. Pain is mild to moderate and she rates it at 4/10. There is no particular pattern to when the pain comes on. Nothing seems to make it better nothing makes it worse. She specifically denies any pain with exertion. There is no associated dyspnea, nausea, diaphoresis. She has had a cold for the last 2 weeks with a nonproductive cough and feeling like there is fluid in her ears. She denies fever or chills but she has not had influenza immunization. She does have cardiac risk factors of tobacco use and family history of coronary artery disease.  History reviewed. No pertinent past medical history. Past Surgical History  Procedure Laterality Date  . Abdominal hysterectomy    . Tonsillectomy    . Cesarean section    . Foot surgery    . Appendectomy    . Tubal ligation     Family History  Problem Relation Age of Onset  . Diabetes Mother   . COPD Mother   . Heart disease Father   . Heart disease Brother   . Drug abuse Brother    History  Substance Use Topics  . Smoking status: Current Every Day Smoker -- 1.00 packs/day    Types: Cigarettes  . Smokeless tobacco: Never Used  . Alcohol Use: Yes     Comment: occasion   OB History    No data available     Review of Systems  Cardiovascular: Positive for chest pain.  All other systems  reviewed and are negative.     Allergies  Amoxicillin  Home Medications   Prior to Admission medications   Medication Sig Start Date End Date Taking? Authorizing Provider  meloxicam (MOBIC) 7.5 MG tablet Take 1 tablet (7.5 mg total) by mouth daily. If your pain is unrelieved, you may take one (1) additional tablet each day. Take with food. 07/31/13   Tatyana A Kirichenko, PA-C  naproxen sodium (ANAPROX) 220 MG tablet Take 440 mg by mouth 2 (two) times daily as needed (pain).    Historical Provider, MD  polyethylene glycol powder (GLYCOLAX/MIRALAX) powder Take 17 g by mouth daily. 08/21/13   Ambrose Finland, NP   BP 147/93 mmHg  Pulse 82  Temp(Src) 98 F (36.7 C) (Oral)  Resp 18  SpO2 100%  LMP 02/07/1998 Physical Exam  Nursing note and vitals reviewed.  44 year old female, resting comfortably and in no acute distress. Vital signs are significant for mild hypertension. Oxygen saturation is 100%, which is normal. Head is normocephalic and atraumatic. PERRLA, EOMI. Oropharynx is mildly erythematous. TMs are clear. There is no sinus tenderness. Neck is nontender and supple without adenopathy or JVD. Back is nontender and there is no CVA tenderness. Lungs are clear without rales, wheezes, or rhonchi. However, there is a slightly prolonged exhalation phase with some harsh  breath sounds. Chest is mildly tender in the right parasternal area but it does not reproduce her pain. Heart has regular rate and rhythm without murmur. Abdomen is soft, flat, nontender without masses or hepatosplenomegaly and peristalsis is normoactive. Extremities have no cyanosis or edema, full range of motion is present. Skin is warm and dry without rash. Neurologic: Mental status is normal, cranial nerves are intact, there are no motor or sensory deficits.  ED Course  Procedures (including critical care time) Labs Review Results for orders placed or performed during the hospital encounter of 03/13/14  Basic  metabolic panel  Result Value Ref Range   Sodium 138 135 - 145 mmol/L   Potassium 3.5 3.5 - 5.1 mmol/L   Chloride 106 96 - 112 mmol/L   CO2 24 19 - 32 mmol/L   Glucose, Bld 100 (H) 70 - 99 mg/dL   BUN 8 6 - 23 mg/dL   Creatinine, Ser 1.61 0.50 - 1.10 mg/dL   Calcium 9.2 8.4 - 09.6 mg/dL   GFR calc non Af Amer >90 >90 mL/min   GFR calc Af Amer >90 >90 mL/min   Anion gap 8 5 - 15  CBC with Differential  Result Value Ref Range   WBC 10.7 (H) 4.0 - 10.5 K/uL   RBC 4.69 3.87 - 5.11 MIL/uL   Hemoglobin 14.4 12.0 - 15.0 g/dL   HCT 04.5 40.9 - 81.1 %   MCV 91.9 78.0 - 100.0 fL   MCH 30.7 26.0 - 34.0 pg   MCHC 33.4 30.0 - 36.0 g/dL   RDW 91.4 78.2 - 95.6 %   Platelets 352 150 - 400 K/uL   Neutrophils Relative % 63 43 - 77 %   Neutro Abs 6.7 1.7 - 7.7 K/uL   Lymphocytes Relative 31 12 - 46 %   Lymphs Abs 3.3 0.7 - 4.0 K/uL   Monocytes Relative 4 3 - 12 %   Monocytes Absolute 0.4 0.1 - 1.0 K/uL   Eosinophils Relative 2 0 - 5 %   Eosinophils Absolute 0.2 0.0 - 0.7 K/uL   Basophils Relative 0 0 - 1 %   Basophils Absolute 0.0 0.0 - 0.1 K/uL  I-stat troponin, ED (not at [facility])  Result Value Ref Range   Troponin i, poc 0.00 0.00 - 0.08 ng/mL   Comment 3           Imaging Review Dg Chest 2 View  03/13/2014   CLINICAL DATA:  44 year old female with a history of shortness of breath.  EXAM: CHEST - 2 VIEW  COMPARISON:  06/20/2010  FINDINGS: Cardiomediastinal silhouette projects within normal limits in size and contour. No confluent airspace disease, pneumothorax, or pleural effusion.  No displaced fracture.  Unremarkable appearance of the upper abdomen.  Unchanged appearance of scoliotic curvature with apex right curvature at the lower thoracic spine.  IMPRESSION: No radiographic evidence of acute cardiopulmonary disease.  Signed,  Yvone Neu. Loreta Ave, DO  [facility]  [facility]   Electronically Signed   By: Gilmer Mor D.O.   On: 03/13/2014 16:30      EKG Interpretation   Date/Time:  Thursday March 13 2014 14:56:36 EST Ventricular Rate:  93 PR Interval:  144 QRS Duration: 76 QT Interval:  352 QTC Calculation: 437 R Axis:   54 Text Interpretation:  Normal sinus rhythm Possible Anterior infarct , age  undetermined Abnormal ECG When compared with ECG of 02/12/1998, No  significant change was found Confirmed by [facility]  MD, Bristol Osentoski ([id number]) on  03/13/2014 6:25:16 PM      MDM   Final diagnoses:  Chest pain, unspecified chest pain type  Acute bronchitis, unspecified organism    Atypical chest pain. This seems to be part of a respiratory tract infection. She will be given an albuterol with ipratropium nebulizer treatment. Old records are reviewed and she does not have any visits for cardiac problems.  She feels much better after albuterol with ipratropium. On exam, lungs are now clear. She is given an albuterol inhaler to take home and is given a prescription for prednisone. Take over-the-counter NSAIDs as needed for pain. She is encouraged to stop smoking. Follow-up with PCP in one week.  Dione Boozeavid Fawnda Vitullo, MD 03/13/14 2010

## 2014-09-14 ENCOUNTER — Ambulatory Visit (INDEPENDENT_AMBULATORY_CARE_PROVIDER_SITE_OTHER): Payer: Self-pay | Admitting: Family Medicine

## 2014-09-14 VITALS — BP 130/80 | HR 89 | Temp 97.7°F | Resp 20 | Ht 64.0 in | Wt 186.5 lb

## 2014-09-14 DIAGNOSIS — R05 Cough: Secondary | ICD-10-CM

## 2014-09-14 DIAGNOSIS — R059 Cough, unspecified: Secondary | ICD-10-CM

## 2014-09-14 DIAGNOSIS — F17200 Nicotine dependence, unspecified, uncomplicated: Secondary | ICD-10-CM

## 2014-09-14 DIAGNOSIS — J069 Acute upper respiratory infection, unspecified: Secondary | ICD-10-CM

## 2014-09-14 DIAGNOSIS — N3001 Acute cystitis with hematuria: Secondary | ICD-10-CM

## 2014-09-14 DIAGNOSIS — Z720 Tobacco use: Secondary | ICD-10-CM

## 2014-09-14 DIAGNOSIS — R300 Dysuria: Secondary | ICD-10-CM

## 2014-09-14 LAB — POCT URINALYSIS DIPSTICK
Bilirubin, UA: NEGATIVE
Glucose, UA: NEGATIVE
KETONES UA: NEGATIVE
NITRITE UA: NEGATIVE
PROTEIN UA: NEGATIVE
Spec Grav, UA: 1.01
UROBILINOGEN UA: 0.2
pH, UA: 7

## 2014-09-14 LAB — POCT UA - MICROSCOPIC ONLY
CRYSTALS, UR, HPF, POC: NEGATIVE
Casts, Ur, LPF, POC: NEGATIVE
MUCUS UA: NEGATIVE
Yeast, UA: NEGATIVE

## 2014-09-14 MED ORDER — BENZONATATE 100 MG PO CAPS: 100.0000 mg | ORAL_CAPSULE | Freq: Three times a day (TID) | ORAL | Status: DC | PRN

## 2014-09-14 MED ORDER — SULFAMETHOXAZOLE-TRIMETHOPRIM 800-160 MG PO TABS: 1.0000 | ORAL_TABLET | Freq: Two times a day (BID) | ORAL | Status: DC

## 2014-09-14 MED ORDER — FLUTICASONE PROPIONATE 50 MCG/ACT NA SUSP: 2.0000 | Freq: Every day | NASAL | Status: DC

## 2014-09-14 MED ORDER — PHENAZOPYRIDINE HCL 200 MG PO TABS: 200.0000 mg | ORAL_TABLET | Freq: Three times a day (TID) | ORAL | Status: DC | PRN

## 2014-09-14 NOTE — Patient Instructions (Addendum)
Take the sulfamethoxazole one pill twice daily for urinary tract infection. It is not the best antibiotics for sinuses and coughing, but should help take care of that also.  Use the fluticasone nose spray 2 sprays each nostril twice daily for 4 days, then once daily to open up the sinuses  Take the cough pills benzonatate 1 or 2 pills 3 times daily as needed for cough  Stop smoking  In addition to the cough pills you can take an over-the-counter cough syrup if necessary, such as Robitussin-DM.  Take the Pyridium (phenazopyridine) one pill 3 times daily as needed for urinary pain. This will probably just be necessary for about 24-48 hours.  Drink plenty of fluids  Return at anytime if further problems  Urinary Tract Infection Urinary tract infections (UTIs) can develop anywhere along your urinary tract. Your urinary tract is your body's drainage system for removing wastes and extra water. Your urinary tract includes two kidneys, two ureters, a bladder, and a urethra. Your kidneys are a pair of bean-shaped organs. Each kidney is about the size of your fist. They are located below your ribs, one on each side of your spine. CAUSES Infections are caused by microbes, which are microscopic organisms, including fungi, viruses, and bacteria. These organisms are so small that they can only be seen through a microscope. Bacteria are the microbes that most commonly cause UTIs. SYMPTOMS  Symptoms of UTIs may vary by age and gender of the patient and by the location of the infection. Symptoms in young women typically include a frequent and intense urge to urinate and a painful, burning feeling in the bladder or urethra during urination. Older women and men are more likely to be tired, shaky, and weak and have muscle aches and abdominal pain. A fever may mean the infection is in your kidneys. Other symptoms of a kidney infection include pain in your back or sides below the ribs, nausea, and  vomiting. DIAGNOSIS To diagnose a UTI, your caregiver will ask you about your symptoms. Your caregiver also will ask to provide a urine sample. The urine sample will be tested for bacteria and white blood cells. White blood cells are made by your body to help fight infection. TREATMENT  Typically, UTIs can be treated with medication. Because most UTIs are caused by a bacterial infection, they usually can be treated with the use of antibiotics. The choice of antibiotic and length of treatment depend on your symptoms and the type of bacteria causing your infection. HOME CARE INSTRUCTIONS  If you were prescribed antibiotics, take them exactly as your caregiver instructs you. Finish the medication even if you feel better after you have only taken some of the medication.  Drink enough water and fluids to keep your urine clear or pale yellow.  Avoid caffeine, tea, and carbonated beverages. They tend to irritate your bladder.  Empty your bladder often. Avoid holding urine for long periods of time.  Empty your bladder before and after sexual intercourse.  After a bowel movement, women should cleanse from front to back. Use each tissue only once. SEEK MEDICAL CARE IF:   You have back pain.  You develop a fever.  Your symptoms do not begin to resolve within 3 days. SEEK IMMEDIATE MEDICAL CARE IF:   You have severe back pain or lower abdominal pain.  You develop chills.  You have nausea or vomiting.  You have continued burning or discomfort with urination. MAKE SURE YOU:   Understand these instructions.  Will  watch your condition.  Will get help right away if you are not doing well or get worse. Document Released: 11/03/2004 Document Revised: 07/26/2011 Document Reviewed: 03/04/2011 [facility] Patient Information 2015 [place], [place]. This information is not intended to replace advice given to you by your health care provider. Make sure you discuss any questions you have with your health  care provider.

## 2014-09-14 NOTE — Progress Notes (Signed)
  Subjective:  Patient ID: Vicki Pena, female    DOB: 10/09/1970  Age: 44 y.o. MRN: [id number]  44 year old lady who comes in today because of acute cystitis. She has had urinary tract infections, and is certain that was going on. She started having dysuria and frequency during the night, also is urinating blood and has low abdominal discomfort. She had chills but no documented fevers. For 2 weeks she's had an upper respiratory infection with head congestion and cough. She is a cigarette smoker, approximately one pack per day.  She works as a Interior and spatial designer. Is not had to miss work because of a cough.  It is been a couple of years since she had urinary tract infection, longer than usual.   Objective:   Pleasant lady alert and oriented. TMs normal. Throat erythematous. Neck supple without significant nodes. Chest is fairly clear to auscultation, slightly coarse breath sounds anteriorly. Heart regular without murmurs. No CVA tenderness. She is tender in the suprapubic area of the abdomen. No ankle edema.  Assessment & Plan:   Assessment:  Probable cystitis with hemorrhagic component Upper respiratory infection and cough Tobacco use  Plan:  Urged to quit smoking Urinalysis  Results for orders placed or performed in visit on 09/14/14  POCT UA - Microscopic Only  Result Value Ref Range   WBC, Ur, HPF, POC TNTC    RBC, urine, microscopic TNTC    Bacteria, U Microscopic 3+    Mucus, UA neg    Epithelial cells, urine per micros 1-5    Crystals, Ur, HPF, POC neg    Casts, Ur, LPF, POC neg    Yeast, UA neg   POCT urinalysis dipstick  Result Value Ref Range   Color, UA yellow    Clarity, UA clear    Glucose, UA neg    Bilirubin, UA neg    Ketones, UA neg    Spec Grav, UA 1.010    Blood, UA moderate    pH, UA 7.0    Protein, UA neg    Urobilinogen, UA 0.2    Nitrite, UA neg    Leukocytes, UA large (3+) (A) Negative     There are no Patient Instructions on file for this  visit.   Legrande Hao, MD 09/14/2014

## 2014-09-16 ENCOUNTER — Ambulatory Visit (INDEPENDENT_AMBULATORY_CARE_PROVIDER_SITE_OTHER): Payer: Self-pay | Admitting: Family Medicine

## 2014-09-16 ENCOUNTER — Telehealth: Payer: Self-pay

## 2014-09-16 ENCOUNTER — Ambulatory Visit (INDEPENDENT_AMBULATORY_CARE_PROVIDER_SITE_OTHER): Payer: Self-pay

## 2014-09-16 VITALS — BP 134/84 | HR 105 | Temp 98.4°F | Resp 14 | Ht 64.0 in | Wt 185.0 lb

## 2014-09-16 DIAGNOSIS — R1031 Right lower quadrant pain: Secondary | ICD-10-CM

## 2014-09-16 DIAGNOSIS — R109 Unspecified abdominal pain: Secondary | ICD-10-CM

## 2014-09-16 DIAGNOSIS — N3090 Cystitis, unspecified without hematuria: Secondary | ICD-10-CM

## 2014-09-16 DIAGNOSIS — N10 Acute tubulo-interstitial nephritis: Secondary | ICD-10-CM

## 2014-09-16 LAB — POCT URINALYSIS DIPSTICK
Bilirubin, UA: NEGATIVE
Glucose, UA: NEGATIVE
Ketones, UA: NEGATIVE
Nitrite, UA: NEGATIVE
PROTEIN UA: 30
Spec Grav, UA: 1.015
UROBILINOGEN UA: 0.2
pH, UA: 6

## 2014-09-16 LAB — POCT UA - MICROSCOPIC ONLY
CASTS, UR, LPF, POC: NEGATIVE
Crystals, Ur, HPF, POC: NEGATIVE
Mucus, UA: NEGATIVE
YEAST UA: NEGATIVE

## 2014-09-16 LAB — POCT CBC
GRANULOCYTE PERCENT: 79.4 % (ref 37–80)
HCT, POC: 46.2 % (ref 37.7–47.9)
HEMOGLOBIN: 14.9 g/dL (ref 12.2–16.2)
Lymph, poc: 2.5 (ref 0.6–3.4)
MCH: 29.6 pg (ref 27–31.2)
MCHC: 32.3 g/dL (ref 31.8–35.4)
MCV: 91.7 fL (ref 80–97)
MID (cbc): 1.8 — AB (ref 0–0.9)
MPV: 7.3 fL (ref 0–99.8)
PLATELET COUNT, POC: 327 10*3/uL (ref 142–424)
POC GRANULOCYTE: 16.7 — AB (ref 2–6.9)
POC LYMPH %: 11.8 % (ref 10–50)
POC MID %: 8.8 %M (ref 0–12)
RBC: 5.03 M/uL (ref 4.04–5.48)
RDW, POC: 14.2 %
WBC: 21 10*3/uL — AB (ref 4.6–10.2)

## 2014-09-16 LAB — URINE CULTURE: Colony Count: >=100000

## 2014-09-16 MED ORDER — HYDROCODONE-ACETAMINOPHEN 10-325 MG PO TABS: 1.0000 | ORAL_TABLET | ORAL | Status: DC | PRN

## 2014-09-16 MED ORDER — KETOROLAC TROMETHAMINE 60 MG/2ML IM SOLN
60.0000 mg | Freq: Once | INTRAMUSCULAR | Status: AC
  Administered 2014-09-16: 60 mg via INTRAMUSCULAR

## 2014-09-16 MED ORDER — HYDROCODONE-ACETAMINOPHEN 10-325 MG PO TABS: 1.0000 | ORAL_TABLET | Freq: Three times a day (TID) | ORAL | Status: DC | PRN

## 2014-09-16 MED ORDER — HYDROCODONE-ACETAMINOPHEN 5-325 MG PO TABS: 1.0000 | ORAL_TABLET | ORAL | Status: DC | PRN

## 2014-09-16 MED ORDER — LEVOFLOXACIN 500 MG PO TABS: 500.0000 mg | ORAL_TABLET | Freq: Every day | ORAL | Status: DC

## 2014-09-16 NOTE — Progress Notes (Signed)
Subjective:  Patient ID: Vicki Pena, female    DOB: 04/20/1970  Age: 44 y.o. MRN: [id number]  Is here for recheck from a couple days ago. She was started on Bactrim and Pyridium on Sunday. She has had some clearing of her urine not urinating blood. However she had been taking the Pyridium which made her urine brown. She did not take it today. She is today developed pain in her right flank and right lower quadrant groin area. She hasn't had much appetite, but not been vomiting. She still has some dysuria. The urine is still cloudy. He has been taking her medication. She may not be drinking a lot of fluids but she still feels like she's been urinating frequently.  She has had a hysterectomy and an appendectomy.  Objective:   Obvious discomfort. She has some tenderness in the CVA region in the lower mid back. On the right. She is tender in the right lower quadrant of the abdomen and groin region, but more tender over the bladder. Bowels moved this morning.  Results for orders placed or performed in visit on 09/16/14  POCT CBC  Result Value Ref Range   WBC 21.0 (A) 4.6 - 10.2 K/uL   Lymph, poc 2.5 0.6 - 3.4   POC LYMPH PERCENT 11.8 10 - 50 %L   MID (cbc) 1.8 (A) 0 - 0.9   POC MID % 8.8 0 - 12 %M   POC Granulocyte 16.7 (A) 2 - 6.9   Granulocyte percent 79.4 37 - 80 %G   RBC 5.03 4.04 - 5.48 M/uL   Hemoglobin 14.9 12.2 - 16.2 g/dL   HCT, POC 95.6 21.3 - 47.9 %   MCV 91.7 80 - 97 fL   MCH, POC 29.6 27 - 31.2 pg   MCHC 32.3 31.8 - 35.4 g/dL   RDW, POC 08.6 %   Platelet Count, POC 327 142 - 424 K/uL   MPV 7.3 0 - 99.8 fL  POCT urinalysis dipstick  Result Value Ref Range   Color, UA yellow    Clarity, UA cloudy    Glucose, UA neg    Bilirubin, UA neg    Ketones, UA neg    Spec Grav, UA 1.015    Blood, UA large    pH, UA 6.0    Protein, UA 30    Urobilinogen, UA 0.2    Nitrite, UA neg    Leukocytes, UA large (3+) (A) Negative  POCT UA - Microscopic Only  Result Value Ref Range    WBC, Ur, HPF, POC tntc    RBC, urine, microscopic tntc    Bacteria, U Microscopic 2+    Mucus, UA neg    Epithelial cells, urine per micros 3-8    Crystals, Ur, HPF, POC neg    Casts, Ur, LPF, POC neg    Yeast, UA neg    [facility] reading (PRIMARY) by  Dr. Alwyn Ren Normal KUB.  Phlebolith in right pelvis, no evidence of kidney stone. Will send for radiology read..    Assessment & Plan:   Assessment:  Right pyelonephritis and cystitis with flank and groin pain  Plan:  Suspect she is resistant to the Bactrim that we begun initially. Will change to Levaquin. She is significantly allergic to amoxicillin so decided to avoid giving her ceftriaxone at this time. We'll give some Toradol IM for the pain. Take hydrocodone pain pills also at home if pain persists. Push fluids. If she gets in all worse she is to  go to the emergency room to get admitted for IV therapy. However will plan to not admit yet since she is not febrile with this.  Patient Instructions  Discontinue the sulfamethoxazole  Begin Levaquin (levofloxacin) 750 mg one daily  Take Norco 5 (hydrocodone 5/325) one every 4-6 hours if needed for pain  Push fluids  Monitor fever. If you become significantly febrile with temperature greater than 100 please go on to the emergency room. If you are feeling significantly worse at anytime go on to the emergency room and you should be admitted.  If not feeling much better tomorrow come back to the office yet again for a follow-up blood count.       HOPPER,DAVID, MD 09/16/2014

## 2014-09-16 NOTE — Telephone Encounter (Signed)
Pt is here to be seen now. 

## 2014-09-16 NOTE — Patient Instructions (Signed)
Discontinue the sulfamethoxazole  Begin Levaquin (levofloxacin) 750 mg one daily  Take Norco 5 (hydrocodone 5/325) one every 4-6 hours if needed for pain  Push fluids  Monitor fever. If you become significantly febrile with temperature greater than 100 please go on to the emergency room. If you are feeling significantly worse at anytime go on to the emergency room and you should be admitted.  If not feeling much better tomorrow come back to the office yet again for a follow-up blood count.

## 2014-09-16 NOTE — Telephone Encounter (Signed)
Pt was recently diagnosed with a uti and is still having symtoms and pain  Best number [phone]

## 2014-09-18 ENCOUNTER — Ambulatory Visit (INDEPENDENT_AMBULATORY_CARE_PROVIDER_SITE_OTHER): Payer: Self-pay | Admitting: Physician Assistant

## 2014-09-18 VITALS — BP 118/74 | HR 77 | Temp 98.3°F | Resp 16 | Ht 64.75 in | Wt 185.0 lb

## 2014-09-18 DIAGNOSIS — Z09 Encounter for follow-up examination after completed treatment for conditions other than malignant neoplasm: Secondary | ICD-10-CM

## 2014-09-18 DIAGNOSIS — N10 Acute tubulo-interstitial nephritis: Secondary | ICD-10-CM

## 2014-09-18 DIAGNOSIS — D72829 Elevated white blood cell count, unspecified: Secondary | ICD-10-CM

## 2014-09-18 DIAGNOSIS — R1084 Generalized abdominal pain: Secondary | ICD-10-CM

## 2014-09-18 LAB — POCT UA - MICROSCOPIC ONLY
CASTS, UR, LPF, POC: NEGATIVE
CRYSTALS, UR, HPF, POC: NEGATIVE
MUCUS UA: NEGATIVE
WBC, Ur, HPF, POC: NEGATIVE
YEAST UA: POSITIVE

## 2014-09-18 LAB — POCT URINALYSIS DIPSTICK
Bilirubin, UA: NEGATIVE
GLUCOSE UA: NEGATIVE
Ketones, UA: NEGATIVE
LEUKOCYTES UA: NEGATIVE
Nitrite, UA: NEGATIVE
PROTEIN UA: NEGATIVE
Spec Grav, UA: 1.01
UROBILINOGEN UA: 0.2
pH, UA: 5.5

## 2014-09-18 LAB — POCT CBC
GRANULOCYTE PERCENT: 70.2 % (ref 37–80)
HCT, POC: 47.1 % (ref 37.7–47.9)
HEMOGLOBIN: 14.2 g/dL (ref 12.2–16.2)
LYMPH, POC: 2.7 (ref 0.6–3.4)
MCH, POC: 28 pg (ref 27–31.2)
MCHC: 30.2 g/dL — AB (ref 31.8–35.4)
MCV: 92.9 fL (ref 80–97)
MID (cbc): 0.7 (ref 0–0.9)
MPV: 7.2 fL (ref 0–99.8)
POC GRANULOCYTE: 8.1 — AB (ref 2–6.9)
POC LYMPH PERCENT: 23.7 %L (ref 10–50)
POC MID %: 6.1 % (ref 0–12)
Platelet Count, POC: 318 10*3/uL (ref 142–424)
RBC: 5.07 M/uL (ref 4.04–5.48)
RDW, POC: 14.7 %
WBC: 11.6 10*3/uL — AB (ref 4.6–10.2)

## 2014-09-18 NOTE — Progress Notes (Signed)
[facility] [address] [place] [address] [phone]  Date:  09/18/2014   Name:  Vicki Pena   DOB:  03/20/1970   MRN:  [id number]  PCP:  Ambrose Finland, NP    History of Present Illness:  Vicki Pena is a 44 y.o. female patient who presents to [facility] for follow up.  Patient was seen here 4 days ago for hematuria and lower abdominal discomfort. She was placed on Bactrim and reported feeling some initial improvement but then she started to develop some abdominal pain that across towards her side she was then switched to Levaquin for presumed pyelonephritis. Urine cultures are still pending. Today she reports that she is feeling somewhat better but she has some low abdominal discomfort. She no longer has any hematuria though the Pyridium alters observation of this. There is little to no dysuria. She had some nausea possibly secondary to taking the Levaquin yesterday. She has not been taking the Norco regularly because the abdominal pain has not been extreme. She has had a partial hysterectomy.   There are no active problems to display for this patient.   No past medical history on file.  Past Surgical History  Procedure Laterality Date  . Abdominal hysterectomy    . Tonsillectomy    . Cesarean section    . Foot surgery    . Appendectomy    . Tubal ligation      Social History  Substance Use Topics  . Smoking status: Current Every Day Smoker -- 1.00 packs/day    Types: Cigarettes  . Smokeless tobacco: Never Used  . Alcohol Use: 0.0 oz/week    0 Standard drinks or equivalent per week     Comment: occasion    Family History  Problem Relation Age of Onset  . Diabetes Mother   . COPD Mother   . Heart disease Father   . Heart disease Brother   . Drug abuse Brother     Allergies  Allergen Reactions  . Amoxicillin Anaphylaxis and Swelling    Face/neck swelling fever lips turned blue    Medication list has been reviewed and  updated.  Current Outpatient Prescriptions on File Prior to Visit  Medication Sig Dispense Refill  . acetaminophen (TYLENOL) 500 MG tablet Take 500 mg by mouth every 6 (six) hours as needed for mild pain.    . fluticasone (FLONASE) 50 MCG/ACT nasal spray Place 2 sprays into both nostrils daily. 16 g 1  . HYDROcodone-acetaminophen (NORCO) 5-325 MG per tablet Take 1 tablet by mouth every 4 (four) hours as needed. 10 tablet 0  . levofloxacin (LEVAQUIN) 500 MG tablet Take 1 tablet (500 mg total) by mouth daily. 7 tablet 0  . naproxen sodium (ANAPROX) 220 MG tablet Take 440 mg by mouth 2 (two) times daily as needed (pain).    . phenazopyridine (PYRIDIUM) 200 MG tablet Take 1 tablet (200 mg total) by mouth 3 (three) times daily as needed for pain. 10 tablet 0  . benzonatate (TESSALON) 100 MG capsule Take 1-2 capsules (100-200 mg total) by mouth 3 (three) times daily as needed. (Patient not taking: Reported on 09/18/2014) 30 capsule 0   No current facility-administered medications on file prior to visit.    ROS ROS otherwise unremarkable unless listed above.  Physical Examination: BP 118/74 mmHg  Pulse 77  Temp(Src) 98.3 F (36.8 C) (Oral)  Resp 16  Ht 5' 4.75" (1.645 m)  Wt 185 lb (83.915 kg)  BMI 31.01 kg/m2  SpO2 95%  LMP 02/07/1998 Ideal Body Weight: Weight in (lb) to have BMI = 25: 148.8  Physical Exam Alert, cooperative, oriented 4 in no acute distress. PERRLA with normal conjunctiva. Breath sounds are normal without wheezing or rhonchi. Regular rate and rhythm without murmurs, rubs, or gallops. Bowel sounds are normal with soft belly. Abdominal tenderness in the pelvic region. There is some mild CVA tenderness at the right side with percussion. Negative Murphy's sign. No hepatosplenomegaly.  Genitourinary exam performed with normal discharge and vaginal mucosa.   Results for orders placed or performed in visit on 09/18/14  POCT CBC  Result Value Ref Range   WBC 11.6 (A) 4.6 -  10.2 K/uL   Lymph, poc 2.7 0.6 - 3.4   POC LYMPH PERCENT 23.7 10 - 50 %L   MID (cbc) 0.7 0 - 0.9   POC MID % 6.1 0 - 12 %M   POC Granulocyte 8.1 (A) 2 - 6.9   Granulocyte percent 70.2 37 - 80 %G   RBC 5.07 4.04 - 5.48 M/uL   Hemoglobin 14.2 12.2 - 16.2 g/dL   HCT, POC 16.1 09.6 - 47.9 %   MCV 92.9 80 - 97 fL   MCH, POC 28.0 27 - 31.2 pg   MCHC 30.2 (A) 31.8 - 35.4 g/dL   RDW, POC 04.5 %   Platelet Count, POC 318 142 - 424 K/uL   MPV 7.2 0 - 99.8 fL  POCT UA - Microscopic Only  Result Value Ref Range   WBC, Ur, HPF, POC Negative    RBC, urine, microscopic 0-2    Bacteria, U Microscopic Trace    Mucus, UA Negative    Epithelial cells, urine per micros 0-2    Crystals, Ur, HPF, POC Negative    Casts, Ur, LPF, POC Negative    Yeast, UA positive   POCT urinalysis dipstick  Result Value Ref Range   Color, UA light yellow    Clarity, UA cloudy    Glucose, UA negative    Bilirubin, UA negative    Ketones, UA negative    Spec Grav, UA 1.010    Blood, UA trace-lysed    pH, UA 5.5    Protein, UA negative    Urobilinogen, UA 0.2    Nitrite, UA negative    Leukocytes, UA Negative Negative   CBC Latest Ref Rng 09/18/2014 09/16/2014 03/13/2014  WBC 4.6 - 10.2 K/uL 11.6(A) 21.0(A) 10.7(H)  Hemoglobin 12.2 - 16.2 g/dL 40.9 81.1 91.4  Hematocrit 37.7 - 47.9 % 47.1 46.2 43.1  Platelets 150 - 400 K/uL - - 352     Orthostatic VS for the past 24 hrs:  BP- Lying Pulse- Lying BP- Sitting Pulse- Sitting BP- Standing at 0 minutes Pulse- Standing at 0 minutes  09/18/14 1045 128/88 mmHg 92 133/85 mmHg 94 (!) 130/92 mmHg 114        Assessment and Plan: 44 year old female with past medical history listed above is here today for follow-up of diagnosed pyelonephritis. She appears to be doing better by CBC and urinalysis. White blood cell has dramatically decreased.  Trace blood seen on urine analysis. Vitals signs and orthostatics are within normal range. I have advised that she return in 48  hours for recheck.  She may contact us if she is doing much better. If the abdominal pain continues, she may need a CT.  She declines Zofran today  Acute pyelonephritis  Follow up  Generalized abdominal pain - Plan:  POCT CBC, POCT UA - Microscopic Only, POCT urinalysis dipstick  Leukocytosis - Plan: POCT CBC, POCT UA - Microscopic Only, POCT urinalysis dipstick  Trena Platt, PA-C [facility] 8/11/20168:19 PM

## 2014-09-18 NOTE — Patient Instructions (Signed)
Continue to take the levaquin and hydrate as much as possible.   Pyelonephritis, Adult Pyelonephritis is a kidney infection. In general, there are 2 main types of pyelonephritis:  Infections that come on quickly without any warning (acute pyelonephritis).  Infections that persist for a long period of time (chronic pyelonephritis). CAUSES  Two main causes of pyelonephritis are:  Bacteria traveling from the bladder to the kidney. This is a problem especially in pregnant women. The urine in the bladder can become filled with bacteria from multiple causes, including:  Inflammation of the prostate gland (prostatitis).  Sexual intercourse in females.  Bladder infection (cystitis).  Bacteria traveling from the bloodstream to the tissue part of the kidney. Problems that may increase your risk of getting a kidney infection include:  Diabetes.  Kidney stones or bladder stones.  Cancer.  Catheters placed in the bladder.  Other abnormalities of the kidney or ureter. SYMPTOMS   Abdominal pain.  Pain in the side or flank area.  Fever.  Chills.  Upset stomach.  Blood in the urine (dark urine).  Frequent urination.  Strong or persistent urge to urinate.  Burning or stinging when urinating. DIAGNOSIS  Your caregiver may diagnose your kidney infection based on your symptoms. A urine sample may also be taken. TREATMENT  In general, treatment depends on how severe the infection is.   If the infection is mild and caught early, your caregiver may treat you with oral antibiotics and send you home.  If the infection is more severe, the bacteria may have gotten into the bloodstream. This will require intravenous (IV) antibiotics and a hospital stay. Symptoms may include:  High fever.  Severe flank pain.  Shaking chills.  Even after a hospital stay, your caregiver may require you to be on oral antibiotics for a period of time.  Other treatments may be required depending upon  the cause of the infection. HOME CARE INSTRUCTIONS   Take your antibiotics as directed. Finish them even if you start to feel better.  Make an appointment to have your urine checked to make sure the infection is gone.  Drink enough fluids to keep your urine clear or pale yellow.  Take medicines for the bladder if you have urgency and frequency of urination as directed by your caregiver. SEEK IMMEDIATE MEDICAL CARE IF:   You have a fever or persistent symptoms for more than 2-3 days.  You have a fever and your symptoms suddenly get worse.  You are unable to take your antibiotics or fluids.  You develop shaking chills.  You experience extreme weakness or fainting.  There is no improvement after 2 days of treatment. MAKE SURE YOU:  Understand these instructions.  Will watch your condition.  Will get help right away if you are not doing well or get worse. Document Released: 01/24/2005 Document Revised: 07/26/2011 Document Reviewed: 06/30/2010 [facility] Patient Information 2015 [place], [place]. This information is not intended to replace advice given to you by your health care provider. Make sure you discuss any questions you have with your health care provider.

## 2014-09-22 ENCOUNTER — Telehealth: Payer: Self-pay

## 2014-09-22 NOTE — Telephone Encounter (Signed)
Pt was seen [id number] for a kidney infection. She took her last antibiotic this morning and is still experiencing a burning sensation when she urinates. She wants to know if she needs to take another round of antibiotics.

## 2014-09-23 NOTE — Telephone Encounter (Signed)
Pt seems to be doing better today. Will call back if sxs return.

## 2016-01-27 ENCOUNTER — Encounter (HOSPITAL_COMMUNITY): Payer: Self-pay | Admitting: Emergency Medicine

## 2016-01-27 ENCOUNTER — Ambulatory Visit (HOSPITAL_COMMUNITY)
Admission: EM | Admit: 2016-01-27 | Discharge: 2016-01-27 | Disposition: A | Discharge status: Home / Self Care | Payer: Self-pay | Attending: Emergency Medicine | Admitting: Emergency Medicine

## 2016-01-27 DIAGNOSIS — N390 Urinary tract infection, site not specified: Secondary | ICD-10-CM

## 2016-01-27 LAB — POCT URINALYSIS DIP (DEVICE)
Bilirubin Urine: NEGATIVE
GLUCOSE, UA: NEGATIVE mg/dL
Nitrite: NEGATIVE
Protein, ur: NEGATIVE mg/dL
SPECIFIC GRAVITY, URINE: 1.025 (ref 1.005–1.030)
Urobilinogen, UA: 0.2 mg/dL (ref 0.0–1.0)
pH: 6.5 (ref 5.0–8.0)

## 2016-01-27 MED ORDER — PHENAZOPYRIDINE HCL 200 MG PO TABS: 200.0000 mg | ORAL_TABLET | Freq: Three times a day (TID) | ORAL | 0 refills | Status: DC

## 2016-01-27 MED ORDER — SULFAMETHOXAZOLE-TRIMETHOPRIM 800-160 MG PO TABS: 1.0000 | ORAL_TABLET | Freq: Two times a day (BID) | ORAL | 0 refills | Status: AC

## 2016-01-27 NOTE — ED Provider Notes (Signed)
[name]: [id number]     Arrival date & time 01/27/16  1312 History   First MD Initiated Contact with Patient 01/27/16 1414     Chief Complaint  Patient presents with  . Urinary Tract Infection   (Consider location/radiation/quality/duration/timing/severity/associated sxs/prior Treatment) [age] year old female presents to clinic with chief complaint of dysuria starting this morning at 10 am. Patient states she has had burning with urination and blood in her urine as well as chills. States she has not had a fever, no nausea, vomiting, or diarrhea, has had some flank pain. Patient reports she has had many previous UTI's and current symptoms are consistent with prior infections.   The history is provided by the patient.    History reviewed. No pertinent past medical history. Past Surgical History:  Procedure Laterality Date  . ABDOMINAL HYSTERECTOMY    . APPENDECTOMY    . CESAREAN SECTION    . FOOT SURGERY    . TONSILLECTOMY    . TUBAL LIGATION     Family History  Problem Relation Age of Onset  . Diabetes Mother   . COPD Mother   . Heart disease Father   . Heart disease Brother   . Drug abuse Brother    Social History  Substance Use Topics  . Smoking status: Current Every Day Smoker    Packs/day: 1.00    Types: Cigarettes  . Smokeless tobacco: Never Used  . Alcohol use 0.0 oz/week     Comment: occasion   OB History    No data available     Review of Systems  Constitutional: Positive for chills. Negative for activity change, diaphoresis, fatigue and fever.  Gastrointestinal: Negative for diarrhea, nausea and vomiting.  Genitourinary: Positive for dysuria, flank pain, frequency, hematuria and urgency. Negative for decreased urine volume, difficulty urinating, enuresis, menstrual problem, pelvic pain, vaginal bleeding, vaginal discharge and vaginal pain.  Musculoskeletal: Positive for back pain.  Neurological: Negative for dizziness and weakness.    Allergies   Amoxicillin  Home Medications   Prior to Admission medications   Medication Sig Start Date End Date Taking? Authorizing Provider  acetaminophen (TYLENOL) 500 MG tablet Take 500 mg by mouth every 6 (six) hours as needed for mild pain.    Historical Provider, MD  benzonatate (TESSALON) 100 MG capsule Take 1-2 capsules (100-200 mg total) by mouth 3 (three) times daily as needed. Patient not taking: Reported on 09/18/2014 09/14/14   Peyton Najjaravid H Hopper, MD  fluticasone [facility](FLONASE) 50 MCG/ACT nasal spray Place 2 sprays into both nostrils daily. 09/14/14   Peyton Najjaravid H Hopper, MD  HYDROcodone-acetaminophen (NORCO) 5-325 MG per tablet Take 1 tablet by mouth every 4 (four) hours as needed. 09/16/14   Peyton Najjaravid H Hopper, MD  levofloxacin (LEVAQUIN) 500 MG tablet Take 1 tablet (500 mg total) by mouth daily. 09/16/14   Peyton Najjaravid H Hopper, MD  naproxen sodium (ANAPROX) 220 MG tablet Take 440 mg by mouth 2 (two) times daily as needed (pain).    Historical Provider, MD  phenazopyridine (PYRIDIUM) 200 MG tablet Take 1 tablet (200 mg total) by mouth 3 (three) times daily as needed for pain. 09/14/14   Peyton Najjaravid H Hopper, MD  phenazopyridine (PYRIDIUM) 200 MG tablet Take 1 tablet (200 mg total) by mouth 3 (three) times daily. 01/27/16   Dorena BodoLawrence Jonothan Heberle, NP  sulfamethoxazole-trimethoprim (BACTRIM DS,SEPTRA DS) 800-160 MG tablet Take 1 tablet by mouth 2 (two) times daily. 01/27/16 02/01/16  Dorena BodoLawrence Josph [place], NP   Meds Ordered and Administered this Visit  Medications - No data to display  BP 137/78 (BP Location: Left Arm)   Pulse 93   Temp 98.4 F (36.9 C) (Oral)   LMP 02/07/1998   SpO2 97%  No data found.   Physical Exam  Constitutional: She is oriented to person, place, and time. She appears well-developed and well-nourished. No distress.  Cardiovascular: Normal rate, regular rhythm and normal heart sounds.   Pulmonary/Chest: Effort normal.  Abdominal: Soft. Bowel sounds are normal. There is no tenderness. There is no rebound and no  guarding.  Musculoskeletal:       Arms: Neurological: She is alert and oriented to person, place, and time.  Skin: Skin is warm and dry. Capillary refill takes less than 2 seconds. She is not diaphoretic. No pallor.  Psychiatric: She has a normal mood and affect.  Nursing note and vitals reviewed.   Urgent Care Course   Clinical Course     Procedures (including critical care time)  Labs Review Labs Reviewed  POCT URINALYSIS DIP (DEVICE) - Abnormal; Notable for the following:       Result Value   Ketones, ur TRACE (*)    Hgb urine dipstick LARGE (*)    Leukocytes, UA SMALL (*)    All other components within normal limits    Imaging Review No results found.   Visual Acuity Review  Right Eye Distance:   Left Eye Distance:   Bilateral Distance:    Right Eye Near:   Left Eye Near:    Bilateral Near:         MDM   1. Lower urinary tract infectious disease    Patient allergic to PCNs (anaphalaxis) Rx Bactrim DS and Pyridum. Should symptoms fail to resolve or worsen follow up with PCP or return to clinic.     Dorena BodoLawrence Javaun Dimperio, NP 01/27/16 1432

## 2016-01-27 NOTE — ED Triage Notes (Signed)
Pt states she has been having bladder spasms, chills, and burning with urination since 1000 this morning.  She also reports seeing blood in her urine at home.  She denies any fever.

## 2016-01-27 NOTE — Discharge Instructions (Signed)
Take medications as prescribed. Follow up with primary care provider should symptoms persist or fail to improve. Should you become febrile, nauseated, start vomiting, or have worsened back pain, return to clinic or follow up with primary care provider.

## 2016-04-25 ENCOUNTER — Ambulatory Visit (HOSPITAL_COMMUNITY)
Admission: EM | Admit: 2016-04-25 | Discharge: 2016-04-25 | Disposition: A | Discharge status: Home / Self Care | Payer: Self-pay | Attending: Family Medicine | Admitting: Family Medicine

## 2016-04-25 ENCOUNTER — Encounter (HOSPITAL_COMMUNITY): Payer: Self-pay | Admitting: Emergency Medicine

## 2016-04-25 DIAGNOSIS — J40 Bronchitis, not specified as acute or chronic: Secondary | ICD-10-CM

## 2016-04-25 MED ORDER — BENZONATATE 100 MG PO CAPS: 100.0000 mg | ORAL_CAPSULE | Freq: Three times a day (TID) | ORAL | 0 refills | Status: DC | PRN

## 2016-04-25 NOTE — ED Triage Notes (Addendum)
See s/s.  Patient reports chest soreness and a lot of mucus

## 2016-04-25 NOTE — Discharge Instructions (Signed)
Continue to push fluids, practice good hand hygiene, and cover your mouth if you cough.  If you start having fevers, shaking or shortness of breath, seek immediate care.  This can last between 1-3 weeks. Seek care if symptoms start to worsen.

## 2016-04-25 NOTE — ED Provider Notes (Signed)
[facility]    [name]: [id number] Arrival date & time: 04/25/16  1715     History   Chief Complaint Chief Complaint  Patient presents with  . URI    HPI Vicki Pena is a 46 y.o. female.   HPI Duration: 4 days  Associated symptoms: sinus congestion, ear fullness, sore throat and dry cough Denies: sinus pain, rhinorrhea, itchy watery eyes, ear drainage, shortness of breath, myalgia and fevers/rigors Treatment to date: Mucinex Sick contacts: No  History reviewed. No pertinent past medical history.   Past Surgical History:  Procedure Laterality Date  . ABDOMINAL HYSTERECTOMY    . APPENDECTOMY    . CESAREAN SECTION    . FOOT SURGERY    . TONSILLECTOMY    . TUBAL LIGATION      OB History    No data available       Home Medications    Prior to Admission medications   Medication Sig Start Date End Date Taking? Authorizing Provider  acetaminophen (TYLENOL) 500 MG tablet Take 500 mg by mouth every 6 (six) hours as needed for mild pain.    Historical Provider, MD  benzonatate (TESSALON) 100 MG capsule Take 1-2 capsules (100-200 mg total) by mouth 3 (three) times daily as needed. Patient not taking: Reported on 09/18/2014 09/14/14   Peyton Najjar, MD  fluticasone [facility]) 50 MCG/ACT nasal spray Place 2 sprays into both nostrils daily. 09/14/14   Peyton Najjar, MD  HYDROcodone-acetaminophen (NORCO) 5-325 MG per tablet Take 1 tablet by mouth every 4 (four) hours as needed. 09/16/14   Peyton Najjar, MD  levofloxacin (LEVAQUIN) 500 MG tablet Take 1 tablet (500 mg total) by mouth daily. 09/16/14   Peyton Najjar, MD  naproxen sodium (ANAPROX) 220 MG tablet Take 440 mg by mouth 2 (two) times daily as needed (pain).    Historical Provider, MD  phenazopyridine (PYRIDIUM) 200 MG tablet Take 1 tablet (200 mg total) by mouth 3 (three) times daily as needed for pain. 09/14/14   Peyton Najjar, MD  phenazopyridine (PYRIDIUM) 200 MG tablet Take 1 tablet (200 mg total) by mouth 3  (three) times daily. 01/27/16   Dorena Bodo, NP    Family History Family History  Problem Relation Age of Onset  . Diabetes Mother   . COPD Mother   . Heart disease Father   . Heart disease Brother   . Drug abuse Brother     Social History Social History  Substance Use Topics  . Smoking status: Current Every Day Smoker    Packs/day: 1.00    Types: Cigarettes  . Smokeless tobacco: Never Used  . Alcohol use 0.0 oz/week     Comment: occasion     Allergies   Amoxicillin   Review of Systems Review of Systems  Constitutional: Negative for fever.  Respiratory: Negative for shortness of breath.      Physical Exam Triage Vital Signs ED Triage Vitals [04/25/16 1806]  Enc Vitals Group     BP (!) 151/85     Pulse Rate 87     Resp 18     Temp 98.4 F (36.9 C)     Temp Source Oral     SpO2 98 %   Updated Vital Signs BP (!) 151/85 (BP Location: Right Arm)   Pulse 87   Temp 98.4 F (36.9 C) (Oral)   Resp 18   LMP 02/07/1998   SpO2 98%   Physical Exam  Constitutional: She  appears well-developed and well-nourished.  HENT:  Head: Normocephalic and atraumatic.  Right Ear: External ear normal.  Left Ear: External ear normal.  Nose: Nose normal.  Mouth/Throat: Oropharynx is clear and moist. No oropharyngeal exudate.  Eyes: EOM are normal. Pupils are equal, round, and reactive to light.  Neck: Normal range of motion. Neck supple.  Cardiovascular: Normal rate and regular rhythm.   No murmur heard. Pulmonary/Chest: Effort normal and breath sounds normal. No respiratory distress.  Skin: Skin is warm and dry. She is not diaphoretic.  Psychiatric: She has a normal mood and affect. Judgment normal.     UC Treatments / Results  Procedures Procedures - none  Initial Impression / Assessment and Plan / UC Course  I have reviewed the triage vital signs and the nursing notes.  Pertinent labs & imaging results that were available during my care of the patient were  reviewed by me and considered in my medical decision making (see chart for details).     46 year old female with few comorbidities presents with upper respiratory symptoms. Likely has bronchitis. The patient that the vast majority of cases of bronchitis are viral in etiology and antibiotics are not helpful. If she does start to experience shaking, fevers, or shortness of breath, she should seek immediate care. Will call in cough suppressant as she is having a dry cough. Continue to push fluids and practice good hand hygiene. Follow-up with PCP if symptoms fail to improve. A letter given for work excusing the patient through Wednesday. The patient voiced understanding and agreement to the plan.  Final Clinical Impressions(s) / UC Diagnoses   Final diagnoses:  None    New Prescriptions New Prescriptions   No medications on file     Jilda Roche [place], DO 04/25/16 2052

## 2016-04-28 ENCOUNTER — Encounter (HOSPITAL_COMMUNITY): Payer: Self-pay | Admitting: Emergency Medicine

## 2016-04-28 ENCOUNTER — Ambulatory Visit (HOSPITAL_COMMUNITY)
Admission: EM | Admit: 2016-04-28 | Discharge: 2016-04-28 | Disposition: A | Discharge status: Home / Self Care | Payer: Self-pay | Attending: Family Medicine | Admitting: Family Medicine

## 2016-04-28 DIAGNOSIS — J209 Acute bronchitis, unspecified: Secondary | ICD-10-CM

## 2016-04-28 DIAGNOSIS — J42 Unspecified chronic bronchitis: Secondary | ICD-10-CM

## 2016-04-28 MED ORDER — AZITHROMYCIN 250 MG PO TABS: 250.0000 mg | ORAL_TABLET | Freq: Every day | ORAL | 0 refills | Status: DC

## 2016-04-28 MED ORDER — PREDNISONE 50 MG PO TABS: ORAL_TABLET | ORAL | 0 refills | Status: DC

## 2016-04-28 NOTE — Discharge Instructions (Signed)
I am treating you today for an acute exacerbation of chronic bronchitis. I prescribed azithromycin, take 2 tablets today then one daily until finished. I prescribed prednisone, one tablet daily until finished with food. I recommend he continue taking the Tessalon prescribed last visit, one tablet every 8 hours. Should your symptoms persist past one week, follow up with your primary care provider or return to clinic.

## 2016-04-28 NOTE — ED Triage Notes (Signed)
Here for a f/u... Seen on 3/19 for similar sx  Reports cough is not getting any better and has become productive... also c/o chest tightness, SOB, dyspnea  Denies fevers, wheezing  Using Tessalon pearles w/some relief  A&O x4... NAD

## 2016-04-28 NOTE — ED Provider Notes (Signed)
[name]: [id number]     Arrival date & time 04/28/16  1152 History   None    Chief Complaint  Patient presents with  . Follow-up   (Consider location/radiation/quality/duration/timing/severity/associated sxs/prior Treatment) 46 year old female presents for follow-up for URI type symptoms. She was seen on 04/25/2016, diagnosed with viral URI, given prescription for Tessalon. She reports she is a pack a day smoker for many years.   The history is provided by the patient.  URI  Presenting symptoms: congestion, cough and fatigue   Presenting symptoms: no fever, no rhinorrhea and no sore throat   Cough:    Cough characteristics:  Productive and hacking   Sputum characteristics:  Green   Severity:  Moderate   Onset quality:  Gradual   Duration:  8 days   Timing:  Constant   Progression:  Unchanged   Chronicity:  New Severity:  Moderate Onset quality:  Gradual Duration:  8 days Timing:  Intermittent Progression:  Unchanged Chronicity:  New Relieved by:  Prescription medications and OTC medications Worsened by:  Nothing Ineffective treatments:  OTC medications and rest Associated symptoms: sinus pain   Associated symptoms: no headaches, no myalgias, no neck pain, no sneezing, no swollen glands and no wheezing     History reviewed. No pertinent past medical history. Past Surgical History:  Procedure Laterality Date  . ABDOMINAL HYSTERECTOMY    . APPENDECTOMY    . CESAREAN SECTION    . FOOT SURGERY    . TONSILLECTOMY    . TUBAL LIGATION     Family History  Problem Relation Age of Onset  . Diabetes Mother   . COPD Mother   . Heart disease Father   . Heart disease Brother   . Drug abuse Brother    Social History  Substance Use Topics  . Smoking status: Current Every Day Smoker    Packs/day: 1.00    Types: Cigarettes  . Smokeless tobacco: Never Used  . Alcohol use 0.0 oz/week     Comment: occasion   OB History    No data available     Review of Systems   Constitutional: Positive for fatigue. Negative for chills and fever.  HENT: Positive for congestion and sinus pain. Negative for rhinorrhea, sneezing and sore throat.   Respiratory: Positive for cough, chest tightness and shortness of breath. Negative for wheezing.   Cardiovascular: Negative for chest pain and palpitations.  Gastrointestinal: Negative for abdominal pain, constipation, nausea and vomiting.  Genitourinary: Negative.   Musculoskeletal: Negative for myalgias and neck pain.  Neurological: Negative for dizziness, weakness and headaches.    Allergies  Amoxicillin  Home Medications   Prior to Admission medications   Medication Sig Start Date End Date Taking? Authorizing Provider  acetaminophen (TYLENOL) 500 MG tablet Take 500 mg by mouth every 6 (six) hours as needed for mild pain.    Historical Provider, MD  azithromycin (ZITHROMAX) 250 MG tablet Take 1 tablet (250 mg total) by mouth daily. Take first 2 tablets together, then 1 every day until finished. 04/28/16   Dorena Bodo, NP  benzonatate (TESSALON) 100 MG capsule Take 1 capsule (100 mg total) by mouth 3 (three) times daily as needed for cough. 04/25/16   Sharlene Dory, DO  predniSONE (DELTASONE) 50 MG tablet Take 1 tablet daily with food 04/28/16   Dorena Bodo, NP   Meds Ordered and Administered this Visit  Medications - No data to display  BP (!) 152/101 (BP Location: Left Arm)   Pulse Marland Kitchen)  110   Temp 98.7 F (37.1 C) (Oral)   Resp 16   LMP 02/07/1998   SpO2 98%  No data found.   Physical Exam  Constitutional: She is oriented to person, place, and time. She appears well-developed and well-nourished. She appears ill. No distress.  HENT:  Head: Normocephalic and atraumatic.  Right Ear: Tympanic membrane and external ear normal.  Left Ear: Tympanic membrane and external ear normal.  Nose: Nose normal. Right sinus exhibits no maxillary sinus tenderness and no frontal sinus tenderness. Left sinus  exhibits no maxillary sinus tenderness and no frontal sinus tenderness.  Mouth/Throat: Uvula is midline and oropharynx is clear and moist. No oropharyngeal exudate.  Eyes: Pupils are equal, round, and reactive to light.  Neck: Normal range of motion. Neck supple. No JVD present.  Cardiovascular: Normal rate and regular rhythm.   Pulmonary/Chest: Effort normal and breath sounds normal. No respiratory distress. She has no wheezes. She has no rhonchi.  Abdominal: Soft. Bowel sounds are normal. She exhibits no distension. There is no tenderness. There is no guarding.  Lymphadenopathy:    She has no cervical adenopathy.  Neurological: She is alert and oriented to person, place, and time.  Skin: Skin is warm and dry. Capillary refill takes less than 2 seconds. She is not diaphoretic.  Psychiatric: She has a normal mood and affect. Her behavior is normal.  Nursing note and vitals reviewed.   Urgent Care Course     Procedures (including critical care time)  Labs Review Labs Reviewed - No data to display  Imaging Review No results found.     MDM   1. Acute exacerbation of chronic bronchitis (HCC)    Treating for acute exacerbation of chronic bronchitis. Provided counseling on OTC medications for symptom management. Rx Azithromycin and Prednisone. Follow up with PCP if symptoms persist past 10 days.      Dorena BodoLawrence Rishik Tubby, NP 04/28/16 1311

## 2016-12-07 ENCOUNTER — Encounter (HOSPITAL_COMMUNITY): Payer: Self-pay | Admitting: *Deleted

## 2016-12-07 ENCOUNTER — Ambulatory Visit (HOSPITAL_COMMUNITY)
Admission: EM | Admit: 2016-12-07 | Discharge: 2016-12-07 | Disposition: A | Discharge status: Home / Self Care | Payer: Self-pay | Attending: Family Medicine | Admitting: Family Medicine

## 2016-12-07 DIAGNOSIS — J029 Acute pharyngitis, unspecified: Secondary | ICD-10-CM

## 2016-12-07 LAB — POCT RAPID STREP A: Streptococcus, Group A Screen (Direct): NEGATIVE

## 2016-12-07 MED ORDER — PHENOL 1.4 % MT LIQD: 1.0000 | OROMUCOSAL | 0 refills | Status: DC | PRN

## 2016-12-07 MED ORDER — CETIRIZINE-PSEUDOEPHEDRINE ER 5-120 MG PO TB12: 1.0000 | ORAL_TABLET | Freq: Every day | ORAL | 0 refills | Status: DC

## 2016-12-07 MED ORDER — SILVER SULFADIAZINE 1 % EX CREA: 1.0000 "application " | TOPICAL_CREAM | Freq: Every day | CUTANEOUS | 0 refills | Status: DC

## 2016-12-07 MED ORDER — FLUTICASONE PROPIONATE 50 MCG/ACT NA SUSP: 2.0000 | Freq: Every day | NASAL | 0 refills | Status: DC

## 2016-12-07 NOTE — Discharge Instructions (Signed)
Rapid strep negative. Symptoms are most likely due to viral illness. Start phenol for sore throat. Flonase and/or Zyrtec-D for nasal congestion. This will also help with your ear pain. You can use over the counter nasal saline rinse such as neti pot for nasal congestion. Monitor for any worsening of symptoms, swelling of the throat, trouble breathing, trouble swallowing, follow up for reevaluation.   For sore throat try using a honey-based tea. Use 3 teaspoons of honey with juice squeezed from half lemon. Place shaved pieces of ginger into 1/2-1 cup of water and warm over stove top. Then mix the ingredients and repeat every 4 hours as needed.  Use silvadene as directed on affected area. If noticing spreading redness, increased warmth, discharge, follow up for reevaluation.

## 2016-12-07 NOTE — ED Provider Notes (Signed)
[facility]    [name]: [id number] Arrival date & time: 12/07/16  [id number]     History   Chief Complaint Chief Complaint  Patient presents with  . Sore Throat  . Otalgia    HPI Vicki Pena is a 46 y.o. female.   46 year old female with history of tonsillectomy, comes in for 4 day history of sore throat and left ear pain. She is also experiencing congestion and headache. Subjective fever with cold chills last night. Denies cough, chest pain, shortness of breath. otc cold medication, tylenol with some relief. No sick contact. Current every day smoker, usually 1ppd, but has had to decrease due to sore throat.   Patient also states accidentally burned herself with grease while cooking and has a blister on the right ring finger. Blister is intact. Taking tylenol for the pain. Denies spreading erythema, increased warmth.       History reviewed. No pertinent past medical history.  There are no active problems to display for this patient.   Past Surgical History:  Procedure Laterality Date  . ABDOMINAL HYSTERECTOMY    . APPENDECTOMY    . CESAREAN SECTION    . FOOT SURGERY    . TONSILLECTOMY    . TUBAL LIGATION      OB History    No data available       Home Medications    Prior to Admission medications   Medication Sig Start Date End Date Taking? Authorizing Provider  acetaminophen (TYLENOL) 500 MG tablet Take 500 mg by mouth every 6 (six) hours as needed for mild pain.    [provider]  cetirizine-pseudoephedrine (ZYRTEC-D) 5-120 MG tablet Take 1 tablet by mouth daily. 12/07/16   Cathie HoopsYu, Chenika V, PA-C  fluticasone (FLONASE) 50 MCG/ACT nasal spray Place 2 sprays into both nostrils daily. 12/07/16   Cathie HoopsYu, Maurisa V, PA-C  phenol (CHLORASEPTIC) 1.4 % LIQD Use as directed 1 spray in the mouth or throat as needed for throat irritation / pain. 12/07/16   Cathie HoopsYu, Maclovia V, PA-C  silver sulfADIAZINE (SILVADENE) 1 % cream Apply 1 application topically daily. 12/07/16   Belinda FisherYu, Isolde  V, PA-C    Family History Family History  Problem Relation Age of Onset  . Diabetes Mother   . COPD Mother   . Heart disease Father   . Heart disease Brother   . Drug abuse Brother     Social History Social History  Substance Use Topics  . Smoking status: Current Every Day Smoker    Packs/day: 1.00    Types: Cigarettes  . Smokeless tobacco: Never Used  . Alcohol use 0.0 oz/week     Comment: occasion     Allergies   Amoxicillin   Review of Systems Review of Systems  HENT: Positive for congestion, ear pain, postnasal drip, sinus pressure and sore throat. Negative for ear discharge, rhinorrhea, trouble swallowing and voice change.   Respiratory: Negative for cough, chest tightness and shortness of breath.   Cardiovascular: Negative for chest pain and palpitations.     Physical Exam Triage Vital Signs ED Triage Vitals  Enc Vitals Group     BP      Pulse      Resp      Temp      Temp src      SpO2      Weight      Height      Head Circumference      Peak Flow  Pain Score      Pain Loc      Pain Edu?      Excl. in GC?    No data found.   Updated Vital Signs BP (!) 156/115 (BP Location: Left Arm)   Pulse 100   Temp 98.5 F (36.9 C) (Oral)   Resp 17   LMP 02/07/1998   SpO2 100%   Physical Exam  Constitutional: She is oriented to person, place, and time. She appears well-developed and well-nourished. No distress.  HENT:  Head: Normocephalic and atraumatic.  Right Ear: External ear and ear canal normal. Tympanic membrane is erythematous. Tympanic membrane is not bulging.  Left Ear: Tympanic membrane, external ear and ear canal normal. Tympanic membrane is not erythematous and not bulging.  Nose: Mucosal edema and rhinorrhea present. Right sinus exhibits no maxillary sinus tenderness and no frontal sinus tenderness. Left sinus exhibits no maxillary sinus tenderness and no frontal sinus tenderness.  Mouth/Throat: Uvula is midline and mucous membranes  are normal. Posterior oropharyngeal erythema present.  Eyes: Pupils are equal, round, and reactive to light. Conjunctivae are normal.  Neck: Normal range of motion. Neck supple.  Cardiovascular: Normal rate, regular rhythm and normal heart sounds.  Exam reveals no gallop and no friction rub.   No murmur heard. Pulmonary/Chest: Effort normal and breath sounds normal. She has no decreased breath sounds. She has no wheezes. She has no rhonchi. She has no rales.  Lymphadenopathy:    She has no cervical adenopathy.  Neurological: She is alert and oriented to person, place, and time.  Skin: Skin is warm and dry.  Blister on right ring finger around the PIP joint. Full ROM of finger.   Psychiatric: She has a normal mood and affect. Her behavior is normal. Judgment normal.     UC Treatments / Results  Labs (all labs ordered are listed, but only abnormal results are displayed) Labs Reviewed  CULTURE, GROUP A STREP [facility])  POCT RAPID STREP A    EKG  EKG Interpretation None       Radiology No results found.  Procedures Procedures (including critical care time)  Medications Ordered in UC Medications - No data to display   Initial Impression / Assessment and Plan / UC Course  I have reviewed the triage vital signs and the nursing notes.  Pertinent labs & imaging results that were available during my care of the patient were reviewed by me and considered in my medical decision making (see chart for details).    Rapid strep negative. Symptomatic treatment as needed. Return precautions given.   Silvadene on affected area. Return precautions given.   Final Clinical Impressions(s) / UC Diagnoses   Final diagnoses:  Acute pharyngitis, unspecified etiology    New Prescriptions Discharge Medication List as of 12/07/2016 10:35 AM    START taking these medications   Details  cetirizine-pseudoephedrine (ZYRTEC-D) 5-120 MG tablet Take 1 tablet by mouth daily., Starting Wed  12/07/2016, Normal    fluticasone (FLONASE) 50 MCG/ACT nasal spray Place 2 sprays into both nostrils daily., Starting Wed 12/07/2016, Normal    phenol (CHLORASEPTIC) 1.4 % LIQD Use as directed 1 spray in the mouth or throat as needed for throat irritation / pain., Starting Wed 12/07/2016, Normal    silver sulfADIAZINE (SILVADENE) 1 % cream Apply 1 application topically daily., Starting Wed 12/07/2016, Normal          Kaira, [place] V, PA-C 12/07/16 1050

## 2016-12-07 NOTE — ED Triage Notes (Signed)
Patient reports sore throat and left sided earache since Sunday. Patient also reports congestion and headache. Reports fever last night.

## 2016-12-10 LAB — CULTURE, GROUP A STREP (THRC)

## 2017-02-20 DIAGNOSIS — D179 Benign lipomatous neoplasm, unspecified: Secondary | ICD-10-CM | POA: Diagnosis not present

## 2017-02-20 DIAGNOSIS — L03317 Cellulitis of buttock: Secondary | ICD-10-CM | POA: Diagnosis not present

## 2017-02-28 DIAGNOSIS — M19011 Primary osteoarthritis, right shoulder: Secondary | ICD-10-CM | POA: Diagnosis not present

## 2017-03-08 DIAGNOSIS — M25511 Pain in right shoulder: Secondary | ICD-10-CM | POA: Diagnosis not present

## 2017-03-09 DIAGNOSIS — Z23 Encounter for immunization: Secondary | ICD-10-CM | POA: Diagnosis not present

## 2017-03-09 DIAGNOSIS — Z13220 Encounter for screening for lipoid disorders: Secondary | ICD-10-CM | POA: Diagnosis not present

## 2017-03-09 DIAGNOSIS — Z0000 Encounter for general adult medical examination without abnormal findings: Secondary | ICD-10-CM | POA: Diagnosis not present

## 2017-03-15 DIAGNOSIS — M25511 Pain in right shoulder: Secondary | ICD-10-CM | POA: Diagnosis not present

## 2017-03-15 DIAGNOSIS — M19011 Primary osteoarthritis, right shoulder: Secondary | ICD-10-CM | POA: Diagnosis not present

## 2017-03-23 DIAGNOSIS — M25511 Pain in right shoulder: Secondary | ICD-10-CM | POA: Diagnosis not present

## 2017-03-29 DIAGNOSIS — Z13220 Encounter for screening for lipoid disorders: Secondary | ICD-10-CM | POA: Diagnosis not present

## 2017-03-29 DIAGNOSIS — Z0000 Encounter for general adult medical examination without abnormal findings: Secondary | ICD-10-CM | POA: Diagnosis not present

## 2017-05-02 DIAGNOSIS — J020 Streptococcal pharyngitis: Secondary | ICD-10-CM | POA: Diagnosis not present

## 2017-05-15 DIAGNOSIS — J029 Acute pharyngitis, unspecified: Secondary | ICD-10-CM | POA: Diagnosis not present

## 2017-05-15 DIAGNOSIS — B373 Candidiasis of vulva and vagina: Secondary | ICD-10-CM | POA: Diagnosis not present

## 2017-05-18 DIAGNOSIS — J029 Acute pharyngitis, unspecified: Secondary | ICD-10-CM | POA: Diagnosis not present

## 2017-07-14 DIAGNOSIS — M25511 Pain in right shoulder: Secondary | ICD-10-CM | POA: Diagnosis not present

## 2017-07-14 DIAGNOSIS — M19011 Primary osteoarthritis, right shoulder: Secondary | ICD-10-CM | POA: Diagnosis not present

## 2017-07-18 ENCOUNTER — Other Ambulatory Visit: Payer: Self-pay | Admitting: Orthopedic Surgery

## 2017-07-18 DIAGNOSIS — M25511 Pain in right shoulder: Secondary | ICD-10-CM

## 2017-07-26 ENCOUNTER — Ambulatory Visit
Admission: RE | Admit: 2017-07-26 | Discharge: 2017-07-26 | Disposition: A | Discharge status: Home / Self Care | Payer: BLUE CROSS/BLUE SHIELD | Source: Ambulatory Visit | Attending: Orthopedic Surgery | Admitting: Orthopedic Surgery

## 2017-07-26 DIAGNOSIS — M19011 Primary osteoarthritis, right shoulder: Secondary | ICD-10-CM | POA: Diagnosis not present

## 2017-07-26 DIAGNOSIS — M25511 Pain in right shoulder: Secondary | ICD-10-CM

## 2017-08-16 NOTE — Progress Notes (Addendum)
PCP: Horton MarshallAnna Becker, MD  Cardiologist: pt denies  EKG: pt denies past year  Stress test: pt denies ever  ECHO: pt denies ever  Cardiac Cath: pt denies ever  Chest x-ray: pt denies past year, no recent respiratory infections/complications

## 2017-08-16 NOTE — Pre-Procedure Instructions (Signed)
Vicki Pena  08/16/2017      [facility]- [place], [place] - [address] [place] [address] Phone: [phone] Fax: [phone]    Your procedure is scheduled on August 25, 2017.  Report to [facility] Admitting at [phone] AM.  Call this number if you have problems the morning of surgery:  [phone]   Remember:  Do not eat or drink after midnight.    Take these medicines the morning of surgery with A SIP OF WATER  Tylenol-if needed   7 days prior to surgery STOP taking any Aspirin (unless otherwise instructed by your surgeon), Aleve, Naproxen, Ibuprofen, Motrin, Advil, Goody's, BC's, all herbal medications, fish oil, and all vitamins    Do not wear jewelry, make-up or nail polish.  Do not wear lotions, powders, or perfumes, or deodorant.  Do not shave 48 hours prior to surgery.   Do not bring valuables to the hospital.  [facility] is not responsible for any belongings or valuables.  Contacts, dentures or bridgework may not be worn into surgery.  Leave your suitcase in the car.  After surgery it may be brought to your room.  For patients admitted to the hospital, discharge time will be determined by your treatment team.  Patients discharged the day of surgery will not be allowed to drive home.    [facility]- Preparing For Surgery  Before surgery, you can play an important role. Because skin is not sterile, your skin needs to be as free of germs as possible. You can reduce the number of germs on your skin by washing with CHG (chlorahexidine gluconate) Soap before surgery.  CHG is an antiseptic cleaner which kills germs and bonds with the skin to continue killing germs even after washing.    Oral Hygiene is also important to reduce your risk of infection.  Remember - BRUSH YOUR TEETH THE MORNING OF SURGERY WITH YOUR REGULAR TOOTHPASTE  Please do not use if you have an allergy to CHG or antibacterial  soaps. If your skin becomes reddened/irritated stop using the CHG.  Do not shave (including legs and underarms) for at least 48 hours prior to first CHG shower. It is OK to shave your face.  Please follow these instructions carefully.   1. Shower the NIGHT BEFORE SURGERY and the MORNING OF SURGERY with CHG.   2. If you chose to wash your hair, wash your hair first as usual with your normal shampoo.  3. After you shampoo, rinse your hair and body thoroughly to remove the shampoo.  4. Use CHG as you would any other liquid soap. You can apply CHG directly to the skin and wash gently with a scrungie or a clean washcloth.   5. Apply the CHG Soap to your body ONLY FROM THE NECK DOWN.  Do not use on open wounds or open sores. Avoid contact with your eyes, ears, mouth and genitals (private parts). Wash Face and genitals (private parts)  with your normal soap.  6. Wash thoroughly, paying special attention to the area where your surgery will be performed.  7. Thoroughly rinse your body with warm water from the neck down.  8. DO NOT shower/wash with your normal soap after using and rinsing off the CHG Soap.  9. Pat yourself dry with a CLEAN TOWEL.  10. Wear CLEAN PAJAMAS to bed the night before surgery, wear comfortable clothes the morning of surgery  11. Place CLEAN  SHEETS on your bed the night of your first shower and DO NOT SLEEP WITH PETS.  Day of Surgery:  Do not apply any deodorants/lotions.  Please wear clean clothes to the hospital/surgery center.   Remember to brush your teeth WITH YOUR REGULAR TOOTHPASTE.  Please read over the following fact sheets that you were given. Pain Booklet, Coughing and Deep Breathing, MRSA Information and Surgical Site Infection Prevention

## 2017-08-17 ENCOUNTER — Encounter (HOSPITAL_COMMUNITY): Payer: Self-pay

## 2017-08-17 ENCOUNTER — Encounter (HOSPITAL_COMMUNITY)
Admission: RE | Admit: 2017-08-17 | Discharge: 2017-08-17 | Disposition: A | Discharge status: Home / Self Care | Payer: BLUE CROSS/BLUE SHIELD | Source: Ambulatory Visit | Attending: Orthopedic Surgery | Admitting: Orthopedic Surgery

## 2017-08-17 ENCOUNTER — Other Ambulatory Visit: Payer: Self-pay

## 2017-08-17 DIAGNOSIS — Z01812 Encounter for preprocedural laboratory examination: Secondary | ICD-10-CM | POA: Insufficient documentation

## 2017-08-17 HISTORY — DX: Adverse effect of unspecified anesthetic, initial encounter: T41.45XA

## 2017-08-17 HISTORY — DX: Other complications of anesthesia, initial encounter: T88.59XA

## 2017-08-17 HISTORY — DX: Other specified postprocedural states: Z98.890

## 2017-08-17 HISTORY — DX: Nausea with vomiting, unspecified: R11.2

## 2017-08-17 LAB — CBC
HEMATOCRIT: 47.2 % — AB (ref 36.0–46.0)
HEMOGLOBIN: 15.2 g/dL — AB (ref 12.0–15.0)
MCH: 29.1 pg (ref 26.0–34.0)
MCHC: 32.2 g/dL (ref 30.0–36.0)
MCV: 90.4 fL (ref 78.0–100.0)
PLATELETS: 361 10*3/uL (ref 150–400)
RBC: 5.22 MIL/uL — AB (ref 3.87–5.11)
RDW: 13.3 % (ref 11.5–15.5)
WBC: 10.5 10*3/uL (ref 4.0–10.5)

## 2017-08-17 LAB — SURGICAL PCR SCREEN
MRSA, PCR: NEGATIVE
Staphylococcus aureus: NEGATIVE

## 2017-08-24 MED ORDER — VANCOMYCIN HCL 10 G IV SOLR
1500.0000 mg | INTRAVENOUS | Status: AC
  Administered 2017-08-25: 1500 mg via INTRAVENOUS
  Administered 2017-08-25: 750 mg via INTRAVENOUS
  Filled 2017-08-24: qty 1500

## 2017-08-24 MED ORDER — SODIUM CHLORIDE 0.9 % IV SOLN
1000.0000 mg | INTRAVENOUS | Status: AC
  Administered 2017-08-25: 1000 mg via INTRAVENOUS
  Filled 2017-08-24: qty 1100

## 2017-08-25 ENCOUNTER — Other Ambulatory Visit: Payer: Self-pay

## 2017-08-25 ENCOUNTER — Inpatient Hospital Stay (HOSPITAL_COMMUNITY): Payer: BLUE CROSS/BLUE SHIELD | Admitting: Certified Registered Nurse Anesthetist

## 2017-08-25 ENCOUNTER — Encounter (HOSPITAL_COMMUNITY)
Admission: RE | Disposition: A | Discharge status: Home / Self Care | Payer: Self-pay | Source: Home / Self Care | Attending: Orthopedic Surgery

## 2017-08-25 ENCOUNTER — Inpatient Hospital Stay (HOSPITAL_COMMUNITY)
Admission: RE | Admit: 2017-08-25 | Discharge: 2017-08-26 | DRG: 483 | Disposition: A | Discharge status: Home / Self Care | Payer: BLUE CROSS/BLUE SHIELD | Attending: Orthopedic Surgery | Admitting: Orthopedic Surgery

## 2017-08-25 ENCOUNTER — Encounter (HOSPITAL_COMMUNITY): Payer: Self-pay | Admitting: Certified Registered Nurse Anesthetist

## 2017-08-25 ENCOUNTER — Inpatient Hospital Stay (HOSPITAL_COMMUNITY): Payer: BLUE CROSS/BLUE SHIELD

## 2017-08-25 DIAGNOSIS — Z96611 Presence of right artificial shoulder joint: Secondary | ICD-10-CM

## 2017-08-25 DIAGNOSIS — G8918 Other acute postprocedural pain: Secondary | ICD-10-CM | POA: Diagnosis not present

## 2017-08-25 DIAGNOSIS — F17210 Nicotine dependence, cigarettes, uncomplicated: Secondary | ICD-10-CM | POA: Diagnosis present

## 2017-08-25 DIAGNOSIS — Z880 Allergy status to penicillin: Secondary | ICD-10-CM

## 2017-08-25 DIAGNOSIS — Z90710 Acquired absence of both cervix and uterus: Secondary | ICD-10-CM | POA: Diagnosis not present

## 2017-08-25 DIAGNOSIS — M19011 Primary osteoarthritis, right shoulder: Secondary | ICD-10-CM | POA: Diagnosis present

## 2017-08-25 HISTORY — PX: TOTAL SHOULDER ARTHROPLASTY: SHX126

## 2017-08-25 SURGERY — ARTHROPLASTY, SHOULDER, TOTAL
Anesthesia: General | Site: Shoulder | Laterality: Right

## 2017-08-25 MED ORDER — ROCURONIUM BROMIDE 10 MG/ML (PF) SYRINGE
PREFILLED_SYRINGE | INTRAVENOUS | Status: AC
  Filled 2017-08-25: qty 10

## 2017-08-25 MED ORDER — SODIUM CHLORIDE 0.9 % IV SOLN
INTRAVENOUS | Status: DC | PRN
  Administered 2017-08-25: 20 ug/min via INTRAVENOUS

## 2017-08-25 MED ORDER — ACETAMINOPHEN 325 MG PO TABS: 325.0000 mg | ORAL_TABLET | Freq: Four times a day (QID) | ORAL | Status: DC | PRN

## 2017-08-25 MED ORDER — BUPIVACAINE LIPOSOME 1.3 % IJ SUSP
INTRAMUSCULAR | Status: DC | PRN
  Administered 2017-08-25: 10 mL via PERINEURAL

## 2017-08-25 MED ORDER — PROPOFOL 10 MG/ML IV BOLUS
INTRAVENOUS | Status: DC | PRN
  Administered 2017-08-25: 120 mg via INTRAVENOUS
  Administered 2017-08-25: 30 mg via INTRAVENOUS
  Administered 2017-08-25: 20 mg via INTRAVENOUS
  Administered 2017-08-25: 30 mg via INTRAVENOUS

## 2017-08-25 MED ORDER — MENTHOL 3 MG MT LOZG: 1.0000 | LOZENGE | OROMUCOSAL | Status: DC | PRN

## 2017-08-25 MED ORDER — HYDROCODONE-ACETAMINOPHEN 7.5-325 MG PO TABS
1.0000 | ORAL_TABLET | ORAL | Status: DC | PRN
  Administered 2017-08-25: 1 via ORAL

## 2017-08-25 MED ORDER — OXYCODONE HCL 5 MG PO TABS: 5.0000 mg | ORAL_TABLET | Freq: Once | ORAL | Status: DC | PRN

## 2017-08-25 MED ORDER — METHOCARBAMOL 500 MG PO TABS
500.0000 mg | ORAL_TABLET | Freq: Four times a day (QID) | ORAL | Status: DC | PRN
  Administered 2017-08-25 – 2017-08-26 (×2): 500 mg via ORAL
  Filled 2017-08-25 (×2): qty 1

## 2017-08-25 MED ORDER — CLINDAMYCIN PHOSPHATE 900 MG/50ML IV SOLN
INTRAVENOUS | Status: AC
  Filled 2017-08-25: qty 50

## 2017-08-25 MED ORDER — LACTATED RINGERS IV SOLN
INTRAVENOUS | Status: DC
  Administered 2017-08-25: 11:00:00 via INTRAVENOUS

## 2017-08-25 MED ORDER — PHENYLEPHRINE 40 MCG/ML (10ML) SYRINGE FOR IV PUSH (FOR BLOOD PRESSURE SUPPORT)
PREFILLED_SYRINGE | INTRAVENOUS | Status: AC
  Filled 2017-08-25: qty 10

## 2017-08-25 MED ORDER — ROCURONIUM BROMIDE 50 MG/5ML IV SOSY
PREFILLED_SYRINGE | INTRAVENOUS | Status: DC | PRN
  Administered 2017-08-25: 10 mg via INTRAVENOUS
  Administered 2017-08-25: 20 mg via INTRAVENOUS
  Administered 2017-08-25: 40 mg via INTRAVENOUS

## 2017-08-25 MED ORDER — ONDANSETRON HCL 4 MG/2ML IJ SOLN
INTRAMUSCULAR | Status: AC
  Filled 2017-08-25: qty 2

## 2017-08-25 MED ORDER — DIPHENHYDRAMINE HCL 50 MG/ML IJ SOLN
12.5000 mg | Freq: Once | INTRAMUSCULAR | Status: AC
Start: 2017-08-25 — End: 2017-08-25
  Administered 2017-08-25: 12.5 mg via INTRAVENOUS
  Filled 2017-08-25: qty 1

## 2017-08-25 MED ORDER — ACETAMINOPHEN 500 MG PO TABS
500.0000 mg | ORAL_TABLET | Freq: Four times a day (QID) | ORAL | Status: DC
  Administered 2017-08-25 – 2017-08-26 (×3): 500 mg via ORAL
  Filled 2017-08-25 (×3): qty 1

## 2017-08-25 MED ORDER — BUPIVACAINE HCL (PF) 0.5 % IJ SOLN
INTRAMUSCULAR | Status: DC | PRN
  Administered 2017-08-25: 15 mL via PERINEURAL

## 2017-08-25 MED ORDER — MORPHINE SULFATE (PF) 2 MG/ML IV SOLN: 0.5000 mg | INTRAVENOUS | Status: DC | PRN

## 2017-08-25 MED ORDER — OXYCODONE HCL 5 MG PO TABS: 5.0000 mg | ORAL_TABLET | Freq: Three times a day (TID) | ORAL | 0 refills | Status: AC | PRN

## 2017-08-25 MED ORDER — ONDANSETRON HCL 4 MG/2ML IJ SOLN
INTRAMUSCULAR | Status: DC | PRN
  Administered 2017-08-25 (×2): 4 mg via INTRAVENOUS

## 2017-08-25 MED ORDER — METOCLOPRAMIDE HCL 5 MG/ML IJ SOLN: 5.0000 mg | Freq: Three times a day (TID) | INTRAMUSCULAR | Status: DC | PRN

## 2017-08-25 MED ORDER — METOCLOPRAMIDE HCL 5 MG PO TABS: 5.0000 mg | ORAL_TABLET | Freq: Three times a day (TID) | ORAL | Status: DC | PRN

## 2017-08-25 MED ORDER — FENTANYL CITRATE (PF) 250 MCG/5ML IJ SOLN
INTRAMUSCULAR | Status: AC
  Filled 2017-08-25: qty 5

## 2017-08-25 MED ORDER — DEXAMETHASONE SODIUM PHOSPHATE 10 MG/ML IJ SOLN
INTRAMUSCULAR | Status: DC | PRN
  Administered 2017-08-25: 10 mg via INTRAVENOUS

## 2017-08-25 MED ORDER — KETOROLAC TROMETHAMINE 15 MG/ML IJ SOLN
15.0000 mg | Freq: Four times a day (QID) | INTRAMUSCULAR | Status: DC
  Administered 2017-08-25 – 2017-08-26 (×3): 15 mg via INTRAVENOUS
  Filled 2017-08-25 (×3): qty 1

## 2017-08-25 MED ORDER — DIPHENHYDRAMINE HCL 12.5 MG/5ML PO ELIX
12.5000 mg | ORAL_SOLUTION | ORAL | Status: DC | PRN
  Administered 2017-08-25: 25 mg via ORAL
  Filled 2017-08-25: qty 10

## 2017-08-25 MED ORDER — 0.9 % SODIUM CHLORIDE (POUR BTL) OPTIME
TOPICAL | Status: DC | PRN
  Administered 2017-08-25: 1000 mL

## 2017-08-25 MED ORDER — PHENOL 1.4 % MT LIQD: 1.0000 | OROMUCOSAL | Status: DC | PRN

## 2017-08-25 MED ORDER — SCOPOLAMINE 1 MG/3DAYS TD PT72
MEDICATED_PATCH | TRANSDERMAL | Status: AC
  Filled 2017-08-25: qty 1

## 2017-08-25 MED ORDER — FENTANYL CITRATE (PF) 100 MCG/2ML IJ SOLN
INTRAMUSCULAR | Status: DC | PRN
  Administered 2017-08-25 (×3): 50 ug via INTRAVENOUS
  Administered 2017-08-25: 100 ug via INTRAVENOUS

## 2017-08-25 MED ORDER — EPHEDRINE SULFATE 50 MG/ML IJ SOLN
INTRAMUSCULAR | Status: AC
  Filled 2017-08-25: qty 1

## 2017-08-25 MED ORDER — LIDOCAINE 2% (20 MG/ML) 5 ML SYRINGE
INTRAMUSCULAR | Status: DC | PRN
  Administered 2017-08-25: 60 mg via INTRAVENOUS

## 2017-08-25 MED ORDER — EPHEDRINE SULFATE-NACL 50-0.9 MG/10ML-% IV SOSY
PREFILLED_SYRINGE | INTRAVENOUS | Status: DC | PRN
  Administered 2017-08-25 (×2): 5 mg via INTRAVENOUS
  Administered 2017-08-25: 10 mg via INTRAVENOUS

## 2017-08-25 MED ORDER — MIDAZOLAM HCL 2 MG/2ML IJ SOLN
INTRAMUSCULAR | Status: AC
  Administered 2017-08-25: 2 mg
  Filled 2017-08-25: qty 2

## 2017-08-25 MED ORDER — CLINDAMYCIN PHOSPHATE 600 MG/50ML IV SOLN
600.0000 mg | Freq: Four times a day (QID) | INTRAVENOUS | Status: AC
  Administered 2017-08-25 – 2017-08-26 (×3): 600 mg via INTRAVENOUS
  Filled 2017-08-25 (×3): qty 50

## 2017-08-25 MED ORDER — CHLORHEXIDINE GLUCONATE 4 % EX LIQD: 60.0000 mL | Freq: Once | CUTANEOUS | Status: DC

## 2017-08-25 MED ORDER — DEXAMETHASONE SODIUM PHOSPHATE 10 MG/ML IJ SOLN
INTRAMUSCULAR | Status: AC
  Filled 2017-08-25: qty 1

## 2017-08-25 MED ORDER — SUGAMMADEX SODIUM 200 MG/2ML IV SOLN
INTRAVENOUS | Status: DC | PRN
  Administered 2017-08-25: 200 mg via INTRAVENOUS

## 2017-08-25 MED ORDER — SENNOSIDES-DOCUSATE SODIUM 8.6-50 MG PO TABS: 1.0000 | ORAL_TABLET | Freq: Every evening | ORAL | Status: DC | PRN

## 2017-08-25 MED ORDER — ONDANSETRON HCL 4 MG/2ML IJ SOLN: 4.0000 mg | Freq: Four times a day (QID) | INTRAMUSCULAR | Status: DC | PRN

## 2017-08-25 MED ORDER — FENTANYL CITRATE (PF) 100 MCG/2ML IJ SOLN: 25.0000 ug | INTRAMUSCULAR | Status: DC | PRN

## 2017-08-25 MED ORDER — SUGAMMADEX SODIUM 200 MG/2ML IV SOLN
INTRAVENOUS | Status: AC
  Filled 2017-08-25: qty 2

## 2017-08-25 MED ORDER — METHOCARBAMOL 1000 MG/10ML IJ SOLN
500.0000 mg | Freq: Four times a day (QID) | INTRAMUSCULAR | Status: DC | PRN
  Filled 2017-08-25: qty 5

## 2017-08-25 MED ORDER — OXYCODONE HCL 5 MG/5ML PO SOLN: 5.0000 mg | Freq: Once | ORAL | Status: DC | PRN

## 2017-08-25 MED ORDER — LIDOCAINE 2% (20 MG/ML) 5 ML SYRINGE
INTRAMUSCULAR | Status: AC
  Filled 2017-08-25: qty 5

## 2017-08-25 MED ORDER — SCOPOLAMINE 1 MG/3DAYS TD PT72
MEDICATED_PATCH | TRANSDERMAL | Status: DC | PRN
  Administered 2017-08-25: 1 via TRANSDERMAL

## 2017-08-25 MED ORDER — ONDANSETRON HCL 4 MG PO TABS: 4.0000 mg | ORAL_TABLET | Freq: Four times a day (QID) | ORAL | Status: DC | PRN

## 2017-08-25 MED ORDER — FENTANYL CITRATE (PF) 100 MCG/2ML IJ SOLN: 50.0000 ug | Freq: Once | INTRAMUSCULAR | Status: DC

## 2017-08-25 MED ORDER — PHENYLEPHRINE 40 MCG/ML (10ML) SYRINGE FOR IV PUSH (FOR BLOOD PRESSURE SUPPORT)
PREFILLED_SYRINGE | INTRAVENOUS | Status: DC | PRN
  Administered 2017-08-25 (×5): 80 ug via INTRAVENOUS

## 2017-08-25 MED ORDER — CLINDAMYCIN PHOSPHATE 900 MG/50ML IV SOLN
900.0000 mg | INTRAVENOUS | Status: AC
  Administered 2017-08-25: 900 mg via INTRAVENOUS

## 2017-08-25 MED ORDER — HYDROCODONE-ACETAMINOPHEN 7.5-325 MG PO TABS
ORAL_TABLET | ORAL | Status: AC
  Filled 2017-08-25: qty 1

## 2017-08-25 MED ORDER — MIDAZOLAM HCL 2 MG/2ML IJ SOLN: 2.0000 mg | Freq: Once | INTRAMUSCULAR | Status: DC

## 2017-08-25 MED ORDER — DOCUSATE SODIUM 100 MG PO CAPS
100.0000 mg | ORAL_CAPSULE | Freq: Two times a day (BID) | ORAL | Status: DC
  Administered 2017-08-25 – 2017-08-26 (×2): 100 mg via ORAL
  Filled 2017-08-25 (×2): qty 1

## 2017-08-25 MED ORDER — ASPIRIN EC 325 MG PO TBEC
325.0000 mg | DELAYED_RELEASE_TABLET | Freq: Every day | ORAL | Status: DC
  Administered 2017-08-25 – 2017-08-26 (×2): 325 mg via ORAL
  Filled 2017-08-25 (×2): qty 1

## 2017-08-25 MED ORDER — HYDROCODONE-ACETAMINOPHEN 5-325 MG PO TABS: 1.0000 | ORAL_TABLET | ORAL | Status: DC | PRN

## 2017-08-25 MED ORDER — ONDANSETRON 4 MG PO TBDP: 4.0000 mg | ORAL_TABLET | Freq: Three times a day (TID) | ORAL | 0 refills | Status: DC | PRN

## 2017-08-25 MED ORDER — FENTANYL CITRATE (PF) 100 MCG/2ML IJ SOLN
INTRAMUSCULAR | Status: AC
  Administered 2017-08-25: 50 ug
  Filled 2017-08-25: qty 2

## 2017-08-25 SURGICAL SUPPLY — 60 items
ALCOHOL 70% 16 OZ (MISCELLANEOUS) ×3 IMPLANT
BIT DRILL 5/64X5 DISP (BIT) ×3 IMPLANT
BLADE SAG 18X100X1.27 (BLADE) ×3 IMPLANT
CALIBRATOR GLENOID VIP 5-D (SYSTAGENIX WOUND MANAGEMENT) ×3 IMPLANT
CEMENT BONE R 1X40 (Cement) ×3 IMPLANT
CLOSURE WOUND 1/2 X4 (GAUZE/BANDAGES/DRESSINGS) ×1
COVER SURGICAL LIGHT HANDLE (MISCELLANEOUS) ×3 IMPLANT
DRAPE INCISE IOBAN 66X45 STRL (DRAPES) ×3 IMPLANT
DRAPE ORTHO SPLIT 77X108 STRL (DRAPES) ×6
DRAPE SURG ORHT 6 SPLT 77X108 (DRAPES) ×2 IMPLANT
DRAPE U-SHAPE 47X51 STRL (DRAPES) ×3 IMPLANT
DRSG ADAPTIC 3X8 NADH LF (GAUZE/BANDAGES/DRESSINGS) IMPLANT
DRSG AQUACEL AG ADV 3.5X10 (GAUZE/BANDAGES/DRESSINGS) ×3 IMPLANT
DRSG PAD ABDOMINAL 8X10 ST (GAUZE/BANDAGES/DRESSINGS) IMPLANT
DURAPREP 26ML APPLICATOR (WOUND CARE) ×3 IMPLANT
ELECT BLADE 4.0 EZ CLEAN MEGAD (MISCELLANEOUS) ×3
ELECT CAUTERY BLADE 6.4 (BLADE) ×3 IMPLANT
ELECT REM PT RETURN 9FT ADLT (ELECTROSURGICAL) ×3
ELECTRODE BLDE 4.0 EZ CLN MEGD (MISCELLANEOUS) ×1 IMPLANT
ELECTRODE REM PT RTRN 9FT ADLT (ELECTROSURGICAL) ×1 IMPLANT
FIBERTAPE TENDON COMP KIT (KITS) ×3 IMPLANT
GAUZE SPONGE 4X4 12PLY STRL (GAUZE/BANDAGES/DRESSINGS) IMPLANT
GLENOID WITH CLEAT SM (Miscellaneous) ×3 IMPLANT
GLOVE BIO SURGEON STRL SZ7.5 (GLOVE) ×3 IMPLANT
GLOVE BIOGEL PI IND STRL 6 (GLOVE) ×1 IMPLANT
GLOVE BIOGEL PI IND STRL 8 (GLOVE) ×1 IMPLANT
GLOVE BIOGEL PI INDICATOR 6 (GLOVE) ×2
GLOVE BIOGEL PI INDICATOR 8 (GLOVE) ×2
GLOVE SURG SS PI 6.0 STRL IVOR (GLOVE) ×3 IMPLANT
GOWN STRL REUS W/ TWL LRG LVL3 (GOWN DISPOSABLE) ×1 IMPLANT
GOWN STRL REUS W/ TWL XL LVL3 (GOWN DISPOSABLE) ×2 IMPLANT
GOWN STRL REUS W/TWL LRG LVL3 (GOWN DISPOSABLE) ×2
GOWN STRL REUS W/TWL XL LVL3 (GOWN DISPOSABLE) ×6
HEAD HUMERAL USP II 44/17 (Head) ×3 IMPLANT
KIT BASIN OR (CUSTOM PROCEDURE TRAY) ×3 IMPLANT
KIT SET UNIVERSAL (KITS) ×3 IMPLANT
KIT TURNOVER KIT B (KITS) ×3 IMPLANT
MANIFOLD NEPTUNE II (INSTRUMENTS) ×3 IMPLANT
NS IRRIG 1000ML POUR BTL (IV SOLUTION) ×3 IMPLANT
PACK SHOULDER (CUSTOM PROCEDURE TRAY) ×3 IMPLANT
PAD ARMBOARD 7.5X6 YLW CONV (MISCELLANEOUS) ×3 IMPLANT
SLING ARM FOAM STRAP LRG (SOFTGOODS) ×3 IMPLANT
SLING ARM FOAM STRAP MED (SOFTGOODS) IMPLANT
SLING ARM IMMOBILIZER LRG (SOFTGOODS) ×3 IMPLANT
SMARTMIX MINI TOWER (MISCELLANEOUS)
SPONGE LAP 18X18 X RAY DECT (DISPOSABLE) ×3 IMPLANT
SPONGE LAP 4X18 RFD (DISPOSABLE) IMPLANT
STEM APEX UNIVERSAL 6MM SHOULD (Stem) ×3 IMPLANT
STRIP CLOSURE SKIN 1/2X4 (GAUZE/BANDAGES/DRESSINGS) ×2 IMPLANT
SUCTION FRAZIER HANDLE 10FR (MISCELLANEOUS) ×2
SUCTION TUBE FRAZIER 10FR DISP (MISCELLANEOUS) ×1 IMPLANT
SUT FIBERWIRE #2 38 T-5 BLUE (SUTURE) ×6
SUT MNCRL AB 4-0 PS2 18 (SUTURE) ×3 IMPLANT
SUT VIC AB 2-0 CT1 27 (SUTURE) ×2
SUT VIC AB 2-0 CT1 TAPERPNT 27 (SUTURE) ×1 IMPLANT
SUTURE FIBERWR #2 38 T-5 BLUE (SUTURE) ×2 IMPLANT
TOWEL OR 17X26 10 PK STRL BLUE (TOWEL DISPOSABLE) ×3 IMPLANT
TOWER SMARTMIX MINI (MISCELLANEOUS) IMPLANT
WATER STERILE IRR 1000ML POUR (IV SOLUTION) IMPLANT
YANKAUER SUCT BULB TIP NO VENT (SUCTIONS) ×3 IMPLANT

## 2017-08-25 NOTE — Progress Notes (Signed)
Called to room, patient complaining of itching on her left forearm . Hives on left  Forearm anterior aspect. Vancomycin paused. Call placed to Dr Maple HudsonMoser. Benadryl 12.5mg  IV ordered.

## 2017-08-25 NOTE — Anesthesia Procedure Notes (Signed)
Anesthesia Regional Block: Interscalene brachial plexus block   Pre-Anesthetic Checklist: ,, timeout performed, Correct Patient, Correct Site, Correct Laterality, Correct Procedure, Correct Position, site marked, Risks and benefits discussed,  Surgical consent,  Pre-op evaluation,  At surgeon's request and post-op pain management  Laterality: Upper and Right  Prep: chloraprep       Needles:  Injection technique: Single-shot  Needle Type: Echogenic Stimulator Needle          Additional Needles:   Procedures:,,,, ultrasound used (permanent image in chart),,,,  Narrative:  Start time: 08/25/2017 12:01 PM End time: 08/25/2017 12:06 PM Injection made incrementally with aspirations every 5 mL.  Performed by: Personally  Anesthesiologist: Val EagleMoser, Danikah Budzik, MD  Additional Notes: H+P and labs reviewed, risks and benefits discussed with patient, procedure tolerated well without complications

## 2017-08-25 NOTE — Brief Op Note (Signed)
08/25/2017  2:57 PM  PATIENT:  Vicki Pena  47 y.o. female  PRE-OPERATIVE DIAGNOSIS:  Right shoulder osteoarthritis  POST-OPERATIVE DIAGNOSIS:  Right shoulder osteoarthritis  PROCEDURE:  Procedure(s): RIGHT TOTAL SHOULDER ARTHROPLASTY (Right)  SURGEON:  Surgeon(s) and Role:    * Yolonda Kidaogers, Camela Wich Patrick, MD - Primary  PHYSICIAN ASSISTANT:   ASSISTANTS: Hart CarwinJustin Queen, RNFA   ANESTHESIA:   regional and general  EBL:  200 mL   BLOOD ADMINISTERED:none  DRAINS: none   LOCAL MEDICATIONS USED:  NONE  SPECIMEN:  No Specimen  DISPOSITION OF SPECIMEN:  N/A  COUNTS:  YES  TOURNIQUET:  * No tourniquets in log *  DICTATION: .Note written in EPIC  PLAN OF CARE: Admit to inpatient   PATIENT DISPOSITION:  PACU - hemodynamically stable.   Delay start of Pharmacological VTE agent (>24hrs) due to surgical blood loss or risk of bleeding: not applicable

## 2017-08-25 NOTE — Transfer of Care (Signed)
Immediate Anesthesia Transfer of Care Note  Patient: Vicki Pena  Procedure(s) Performed: RIGHT TOTAL SHOULDER ARTHROPLASTY (Right Shoulder)  Patient Location: PACU  Anesthesia Type:GA combined with regional for post-op pain  Level of Consciousness: awake, alert  and oriented  Airway & Oxygen Therapy: Patient Spontanous Breathing and Patient connected to face mask oxygen  Post-op Assessment: Report given to RN and Post -op Vital signs reviewed and stable  Post vital signs: Reviewed and stable  Last Vitals:  Vitals Value Taken Time  BP    Temp    Pulse 105 08/25/2017  3:07 PM  Resp 17 08/25/2017  3:07 PM  SpO2 91 % 08/25/2017  3:07 PM  Vitals shown include unvalidated device data.  Last Pain:  Vitals:   08/25/17 1033  TempSrc:   PainSc: 3          Complications: No apparent anesthesia complications

## 2017-08-25 NOTE — Anesthesia Procedure Notes (Signed)
Procedure Name: Intubation Date/Time: 08/25/2017 12:28 PM Performed by: Pearson Grippeobertson, Erisha Paugh M, CRNA Pre-anesthesia Checklist: Patient identified, Emergency Drugs available, Suction available and Patient being monitored Patient Re-evaluated:Patient Re-evaluated prior to induction Oxygen Delivery Method: Circle system utilized Preoxygenation: Pre-oxygenation with 100% oxygen Induction Type: IV induction Ventilation: Mask ventilation without difficulty Laryngoscope Size: Miller and 2 Grade View: Grade II Tube type: Oral Number of attempts: 1 Airway Equipment and Method: Stylet Placement Confirmation: ETT inserted through vocal cords under direct vision,  positive ETCO2 and breath sounds checked- equal and bilateral Secured at: 21 cm Tube secured with: Tape Dental Injury: Teeth and Oropharynx as per pre-operative assessment  Comments: Small, stiff epiglottis, anterior, narrow glottis.

## 2017-08-25 NOTE — Op Note (Signed)
Date of Surgery: 08/25/2017  INDICATIONS: [name] is a 47 y.o.-year-old female with a right end-stage glenohumeral arthritis;  The patient did consent to the procedure after discussion of the risks and benefits.  PREOPERATIVE DIAGNOSIS:  Right shoulder glenohumeral arthritis  POSTOPERATIVE DIAGNOSIS: Same.  PROCEDURE:  1.  Long head biceps tendon transfer to pectoralis major tendon, right. 2.  Right total shoulder arthroplasty  SURGEON: Maryan RuedJason P Kruz Chiu, M.D.  ASSIST: Hart CarwinJustin Queen, RNFA.  ANESTHESIA:  general, interscalene block  IV FLUIDS AND URINE: See anesthesia.  ESTIMATED BLOOD LOSS: 200 mL.  IMPLANTS:  Arthrex 6 mm apex stem, 44-17 humeral head, small vault lock glenoid. Arthrex fiber tape tendon compression bridge for subscapularis repair.  DRAINS: None  COMPLICATIONS: None.  DESCRIPTION OF PROCEDURE: After obtaining informed consent,The patient was brought to the operating room and placed supine on the operating table.  The patient had been signed prior to the procedure and this was documented. The patient had the anesthesia placed by the anesthesiologist.  A time-out was performed to confirm that this was the correct patient, site, side and location. The patient did receive antibiotics prior to the incision and was re-dosed during the procedure as needed at indicated intervals.   The patient had the operative extremity prepped and draped in the standard surgical fashion.   The head and neck were nicely stabilized.  Patient was then set up into the beachchair position.   A 10 blade was used to make a standard deltopectoral approach to the shoulder.  Dissection was carried out through the subcutaneous tissue to where the deltopectoral interval was visualized and developed.  The cephalic vein was mobilized and retracted laterally for the duration of the case.  The subacromial space was cleared bluntly retractors were placed appropriately deep to the pectoralis major tendon and  deep to the deltoid muscle fascia.  The upper portion of the pectoralis muscle insertion on the humerus was sharply released.  There were no obvious tears in the supraspinatus, infraspinatus, or teres minor.  The long head biceps tendon was identified in the intertubercular groove.  This was tenotomized in situ.  The free limb of the biceps tendon was then transferred to the pectoralis major tendons upper border.  This was secured in place with 2 separate figure-of-eight throws of #2 FiberWire.  Next I performed a subscapularis takedown and a peel fashion.  Subscapularis, rotator interval, and the inferior capsule were all released.  There were inferior humeral osteophytes that were removed with rongeur and curved osteotome.  After, Releases, a humeral head osteotomy was performed in 30 of retroversion and at 135 head neck angle.  A protractor was placed within the glenoid vault to protect axillary nerve and posterior capsular structures.  Next we did perform an anterior capsulectomy.  Next we prepared the humerus.  Humeral canal was first reamed up to a size 7 reamer.  Next maintaining 30 of retroversion we broached up to a size 6 mm Apex stem And this did have excellent press fit and rotational stability.  We then placed a humeral head protector and turned our attention to preparing the glenoid.  Retractors were placed anteriorly, superiorly, and posteriorly and the labrum was excised.  Exposure of the glenoid was obtained.  The center drill bit was used followed by sequential reaming.  Next using the pegged glenoid guide the holes were drilled appropriately.  The small size glenoid proved to be the appropriate size for this glenoid.  Next the holes were drilled and  chiseled appropriately.  Glenoid  was washed with antibiotic irrigation.  Next glenoid vault was dried.  We trialed once again and were satisfied with the small sized the glenoid.  Next, using antibodies cementing the superior and inferior  holes the glenoid was cemented into place.  Of note, the center ball-like peg was filled with humeral head autograft and not cemented per the manufacturer's recommendation.  Excellent fixation of the glenoid was obtained.   We then turned our attention back to the humeral side.  The humeral stem was found to still be rotationally stable and the final stem was opened at the above size.  2 drill holes were placed in the bicipital tuberosity for subscapularis repair.  One drill hole was placed in the lesser tuberosity.  Utilizing the compression bridge instrumentation we passed a double-armed suture tape through the  drill holes.  We then passed the tube double-armed fiber tapes through the biceps groove holes and through the canal.  The inferior one was passed also through the lesser tuberosity drill hole.  The superior one was left available to place into the calcar of the implant.  The stem was malleted into place with excellent fixation.  The Arthrex guide was then used to secure the collar at the appropriate inclination on the humeral stem.  We then trialed humeral heads and found the 44 x 17 mm humeral head to be satisfactory.  This demonstrated posterior pushback of just at 50% with spontaneous reduction and likewise inferior pull down of 50% with spontaneous reduction.  That head was then removed and the final humeral head was malleted in place with the above size.   Lastly we turned our attention to the subscapularis repair.  The shoulder was copiously irrigated once again with antibiotic solution.  The 2 double-arm sutures were passed through the subscapularis tendon one superior and one inferior.  They were then separated to produce 4 limbs of suture.  Then these were passed through the lateral row suture holes.  One suture limb from each of the subscapularis tendon passes taken to each lateral not.  Next utilizing the tensioning device retention each not separately.  We then checked for security of  the repair by rotating the humerus internal and external and found there to be no gapping or loosening of the subscapularis repair.  This was done in 45 of abduction and external rotation.  The wound was once again copious bleed irrigated using antibiotic solution and then normal saline.  We evaluated once again for hemostasis.  We did place 2 figure-of-eight sutures in the rotator interval with #2 FiberWire.  The long head biceps tendon was identified in the the wound was closed in layers with a #2 FiberWire in the deltopectoral interval.  2-0 Vicryl for the deep dermal layer and 3-0 Monocryl in a subcuticular fashion followed by Dermabond glue.  A standard sterile occlusive dressing was applied as well as a postoperative sling.   The patient tolerated the procedure well.  All counts were correct 2.  There were no intraoperative complications.  The patient was transferred to PACU in stable condition.   Disposition: The patient will be nonweightbearing to the operative extremity for proximally 4-6 weeks.  SHe will begin physical therapy in the outpatient setting.  She will be admitted for routine postoperative care including antibiotics, pain control, and DVT prophylaxis.  Sling will be maintained at all times.  Return for wound check in approximately 2 weeks and then again at 6 weeks.

## 2017-08-25 NOTE — Anesthesia Preprocedure Evaluation (Signed)
Anesthesia Evaluation  Patient identified by MRN, date of birth, ID band Patient awake    Reviewed: Allergy & Precautions, NPO status , Patient's Chart, lab work & pertinent test results  History of Anesthesia Complications (+) PONV and history of anesthetic complications  Airway Mallampati: III  TM Distance: <3 FB Neck ROM: Full    Dental  (+) Teeth Intact   Pulmonary Current Smoker,    breath sounds clear to auscultation       Cardiovascular negative cardio ROS   Rhythm:Regular     Neuro/Psych negative neurological ROS  negative psych ROS   GI/Hepatic negative GI ROS, Neg liver ROS,   Endo/Other  negative endocrine ROS  Renal/GU negative Renal ROS     Musculoskeletal  (+) Arthritis ,   Abdominal   Peds  Hematology   Anesthesia Other Findings   Reproductive/Obstetrics                             Anesthesia Physical Anesthesia Plan  ASA: II  Anesthesia Plan: General and Regional   Post-op Pain Management:  Regional for Post-op pain   Induction: Intravenous  PONV Risk Score and Plan: 3 and Ondansetron, Dexamethasone, Midazolam, Scopolamine patch - Pre-op and Diphenhydramine  Airway Management Planned: Oral ETT  Additional Equipment: None  Intra-op Plan:   Post-operative Plan: Extubation in OR  Informed Consent: I have reviewed the patients History and Physical, chart, labs and discussed the procedure including the risks, benefits and alternatives for the proposed anesthesia with the patient or authorized representative who has indicated his/her understanding and acceptance.   Dental advisory given  Plan Discussed with: CRNA and Surgeon  Anesthesia Plan Comments:         Anesthesia Quick Evaluation

## 2017-08-25 NOTE — H&P (Signed)
ORTHOPAEDIC H and P  REQUESTING PHYSICIAN: Yolonda Kidaogers, Malyiah Fellows Patrick, MD  PCP:  Patient, No Pcp Per  Chief Complaint: Right shoulder arthritis  HPI: Vicki Pena is a 47 y.o. female who complains of worsening right shoulder pain and stiffness over the past couple of years.  She is known to have end-stage right shoulder glenohumeral arthritis.  She presents today for total shoulder arthroplasty of the right shoulder.  We have previously discussed recommendation for that with her in the office and she presents today for that surgery.  She has no new complaints at this time.  She has no new questions.  Past Medical History:  Diagnosis Date  . Complication of anesthesia   . PONV (postoperative nausea and vomiting)    Past Surgical History:  Procedure Laterality Date  . ABDOMINAL HYSTERECTOMY    . APPENDECTOMY    . CESAREAN SECTION    . FOOT SURGERY    . TONSILLECTOMY    . TUBAL LIGATION     Social History   Socioeconomic History  . Marital status: Married    Spouse name: Not on file  . Number of children: Not on file  . Years of education: Not on file  . Highest education level: Not on file  Occupational History  . Not on file  Social Needs  . Financial resource strain: Not on file  . Food insecurity:    Worry: Not on file    Inability: Not on file  . Transportation needs:    Medical: Not on file    Non-medical: Not on file  Tobacco Use  . Smoking status: Current Every Day Smoker    Packs/day: 1.00    Types: Cigarettes  . Smokeless tobacco: Never Used  Substance and Sexual Activity  . Alcohol use: Yes    Alcohol/week: 0.0 oz    Comment: occasion  . Drug use: No  . Sexual activity: Not on file  Lifestyle  . Physical activity:    Days per week: Not on file    Minutes per session: Not on file  . Stress: Not on file  Relationships  . Social connections:    Talks on phone: Not on file    Gets together: Not on file    Attends religious service: Not on file   Active member of club or organization: Not on file    Attends meetings of clubs or organizations: Not on file    Relationship status: Not on file  Other Topics Concern  . Not on file  Social History Narrative  . Not on file   Family History  Problem Relation Age of Onset  . Diabetes Mother   . COPD Mother   . Heart disease Father   . Heart disease Brother   . Drug abuse Brother    Allergies  Allergen Reactions  . Amoxicillin Anaphylaxis and Swelling    Face/neck swelling fever lips turned blue   Prior to Admission medications   Medication Sig Start Date End Date Taking? Authorizing Provider  acetaminophen (TYLENOL) 500 MG tablet Take 1,000 mg by mouth every 8 (eight) hours as needed for mild pain or moderate pain.    Yes [provider]   No results found.  Positive ROS: All other systems have been reviewed and were otherwise negative with the exception of those mentioned in the HPI and as above.  Physical Exam: General: Alert, no acute distress Cardiovascular: No pedal edema Respiratory: No cyanosis, no use of accessory musculature GI:  No organomegaly, abdomen is soft and non-tender Skin: No lesions in the area of chief complaint Neurologic: Sensation intact distally Psychiatric: Patient is competent for consent with normal mood and affect Lymphatic: No axillary or cervical lymphadenopathy    Assessment: End-stage right shoulder glenohumeral arthritis.  Plan: -Plan for total shoulder arthroplasty of the right shoulder today.  We again reviewed the risk benefits of this procedure including but not limited to bleeding, infection, damage to surrounding neurovascular structures, failure of implants, fracture of bones, need for future surgery, and the risk associated with anesthesia.  She has provided informed consent. -We will plan to admit her postoperatively for routine postoperative management and pain control and therapy.    Yolonda Kida, MD Cell  [phone]    08/25/2017 11:51 AM

## 2017-08-25 NOTE — Plan of Care (Signed)
  Problem: Pain Managment: Goal: General experience of comfort will improve Outcome: Progressing   

## 2017-08-25 NOTE — Discharge Instructions (Signed)
Discharge instructions:  -No weightbearing to right upper extremity.  Maintain your sling at all times other than when doing pendulums or showering. -Maintain your postoperative bandage until your follow-up appointment.  This bandage is waterproof and you may begin showering on postoperative day #2.  Do not submerge underwater. -Take 1 full strength aspirin daily for 6 weeks for prevention of blood clots. -Return to see Dr. Aundria Rudogers in 2 weeks for wound check.

## 2017-08-26 MED ORDER — ACETAMINOPHEN 500 MG PO TABS: 500.0000 mg | ORAL_TABLET | Freq: Four times a day (QID) | ORAL | 0 refills | Status: DC

## 2017-08-26 MED ORDER — ASPIRIN 325 MG PO TBEC: 325.0000 mg | DELAYED_RELEASE_TABLET | Freq: Every day | ORAL | 0 refills | Status: DC

## 2017-08-26 MED ORDER — DOCUSATE SODIUM 100 MG PO CAPS: 100.0000 mg | ORAL_CAPSULE | Freq: Two times a day (BID) | ORAL | 0 refills | Status: DC

## 2017-08-26 NOTE — Progress Notes (Signed)
Subjective: 1 Day Post-Op Procedure(s) (LRB): RIGHT TOTAL SHOULDER ARTHROPLASTY (Right) Patient reports pain as 3 on 0-10 scale.    Objective: Vital signs in last 24 hours: Temp:  [97.5 F (36.4 C)-98.2 F (36.8 C)] 97.7 F (36.5 C) (07/20 0524) Pulse Rate:  [60-90] 60 (07/20 0524) Resp:  [16-21] 20 (07/19 1643) BP: (101-122)/(62-96) 101/62 (07/20 0524) SpO2:  [91 %-100 %] 97 % (07/20 0524) Weight:  [75.2 kg (165 lb 11.2 oz)] 75.2 kg (165 lb 11.2 oz) (07/19 1643)  Intake/Output from previous day: 07/19 0701 - 07/20 0700 In: 1278.3 [P.O.:200; I.V.:868.3; IV Piggyback:210] Out: 200 [Blood:200] Intake/Output this shift: No intake/output data recorded.  No results for input(s): HGB in the last 72 hours. No results for input(s): WBC, RBC, HCT, PLT in the last 72 hours. No results for input(s): NA, K, CL, CO2, BUN, CREATININE, GLUCOSE, CALCIUM in the last 72 hours. No results for input(s): LABPT, INR in the last 72 hours.  Intact pulses distally Dorsiflexion/Plantar flexion intact Incision: dressing C/D/I No cellulitis present Compartment soft Still with block affect numbness Digits and wrist.   Assessment/Plan: 1 Day Post-Op Procedure(s) (LRB): RIGHT TOTAL SHOULDER ARTHROPLASTY (Right) Advance diet Up with therapy D/C IV fluids Discharge home with home health  Instructions given. Watch for swelling. Titrate pain meds. See Dr. Junita Pushodgers 2 weeks.    Vicki Pena C 08/26/2017, 8:28 AM

## 2017-08-26 NOTE — Anesthesia Postprocedure Evaluation (Signed)
Anesthesia Post Note  Patient: Talissa A Dumas  Procedure(s) Performed: RIGHT TOTAL SHOULDER ARTHROPLASTY (Right Shoulder)     Patient location during evaluation: PACU Anesthesia Type: General Level of consciousness: awake and alert Pain management: pain level controlled Vital Signs Assessment: post-procedure vital signs reviewed and stable Respiratory status: spontaneous breathing, nonlabored ventilation, respiratory function stable and patient connected to nasal cannula oxygen Cardiovascular status: blood pressure returned to baseline and stable Postop Assessment: no apparent nausea or vomiting Anesthetic complications: no    Last Vitals:  Vitals:   08/26/17 0009 08/26/17 0524  BP: 102/66 101/62  Pulse: 68 60  Resp:    Temp: (!) 36.4 C 36.5 C  SpO2: 95% 97%    Last Pain:  Vitals:   08/26/17 0612  TempSrc:   PainSc: 0-No pain                 Phillips Groutarignan, Jaydah Stahle

## 2017-08-26 NOTE — Evaluation (Addendum)
Occupational Therapy Evaluation Patient Details Name: Vicki Pena A Deyarmin MRN: [id number] DOB: 1970/08/17 Today's Date: 08/26/2017    History of Present Illness Pt is a 47 y.o. female s/p R total shoulder arthroplasty with long head biceps tendon transfer to pectoralis major tendon. She has a PMH significant for post-operative nausea and vomiting and past surgical history significant for abdominal hysterectomy, appendectomy, cesarean section, foot surgery, tonsillectomy.   Clinical Impression   PTA, pt was independent with ADL and functional mobility. Pt currently requiring max assist for UB dressing, min assist for LB ADL, and min assist for UB bathing. Pt's husband demonstrates the ability to provide all necessary assistance without difficulty. Pt able to complete functional mobility and ADL transfers with modified independence. Pt and family educated concerning conservative shoulder protocol, NWB R UE, no movement of R shoulder, methods for dressing and bathing without moving R shoulder, and sling wear schedule. Additionally educated concerning AROM R elbow/wrist/hand. Pt and family verbalize and demonstrate understanding of all topics. No further acute OT needs identified. Recommend progression of shoulder rehabilitation per MD.    Follow Up Recommendations  Follow surgeon's recommendation for DC plan and follow-up therapies    Equipment Recommendations  None recommended by OT    Recommendations for Other Services       Precautions / Restrictions Precautions Precautions: Shoulder Type of Shoulder Precautions: Conservative protocol: sling at all times except ADL/exercise, NWB R UE, AROM R elbow/wrist/hand to tolerance, no PROM/AROM R shoulder. Shoulder Interventions: Shoulder sling/immobilizer;Off for dressing/bathing/exercises;At all times Precaution Booklet Issued: Yes (comment) Precaution Comments: Reviewed conservative shoulder protocol in detail with pt and husband.  Required Braces or  Orthoses: Sling(R shoulder) Restrictions Weight Bearing Restrictions: Yes RUE Weight Bearing: Non weight bearing      Mobility Bed Mobility               General bed mobility comments: Seated at EOB on my arrival.   Transfers Overall transfer level: Modified independent                    Balance Overall balance assessment: No apparent balance deficits (not formally assessed)                                         ADL either performed or assessed with clinical judgement   ADL Overall ADL's : Needs assistance/impaired Eating/Feeding: Sitting;Minimal assistance   Grooming: Sitting;Minimal assistance Grooming Details (indicate cue type and reason): assist for bimanual tasks Upper Body Bathing: Sitting;Maximal assistance;With caregiver independent assisting   Lower Body Bathing: Minimal assistance;Sit to/from stand   Upper Body Dressing : Sitting;With caregiver independent assisting;Minimal assistance   Lower Body Dressing: Minimal assistance;Sit to/from stand   Toilet Transfer: Ambulation;Modified Independent   Toileting- Clothing Manipulation and Hygiene: Modified independent;Sit to/from stand   Tub/ Shower Transfer: Modified independent;Tub transfer;Ambulation   Functional mobility during ADLs: Modified independent General ADL Comments: Pt's husband was able to demonstrate assistance for all tasks. Educated concerning compensatory UB ADL strategies. Pt able to complete mobility with modified independence. However, she requires max assist for UB ADL from husband.      Vision Baseline Vision/History: Wears glasses Patient Visual Report: No change from baseline Vision Assessment?: No apparent visual deficits     Perception     Praxis      Pertinent Vitals/Pain Pain Assessment: No/denies pain(R UE numbness due to nerve block)  Hand Dominance Right   Extremity/Trunk Assessment Upper Extremity Assessment Upper Extremity  Assessment: RUE deficits/detail RUE Deficits / Details: RUE numb in median nerve distribution but ulnar nerve sensation returning. Able to complete composite fist. Minimal AROM R elbow/wrist due to nerve block. No ROM of shoulder per protocol.    Lower Extremity Assessment Lower Extremity Assessment: Overall WFL for tasks assessed       Communication Communication Communication: No difficulties   Cognition Arousal/Alertness: Awake/alert Behavior During Therapy: WFL for tasks assessed/performed Overall Cognitive Status: Within Functional Limits for tasks assessed                                     General Comments  Husband present and engaged in session.     Exercises Exercises: Shoulder Shoulder Exercises Elbow Flexion: AAROM;Right;10 reps;Standing Elbow Extension: AAROM;Right;10 reps;Standing Wrist Flexion: AROM;Right;10 reps;Standing Wrist Extension: AROM;Right;10 reps;Standing Digit Composite Flexion: AROM;Right;10 reps;Standing Composite Extension: AROM;10 reps;Standing   Shoulder Instructions Shoulder Instructions Donning/doffing shirt without moving shoulder: Maximal assistance;Caregiver independent with task Method for sponge bathing under operated UE: Minimal assistance;Caregiver independent with task Donning/doffing sling/immobilizer: Maximal assistance;Caregiver independent with task Correct positioning of sling/immobilizer: Caregiver independent with task;Supervision/safety Pendulum exercises (written home exercise program): (n/a) ROM for elbow, wrist and digits of operated UE: Modified independent Sling wearing schedule (on at all times/off for ADL's): Modified independent Proper positioning of operated UE when showering: Modified independent Positioning of UE while sleeping: Minimal assistance;Caregiver independent with task    Home Living Family/patient expects to be discharged to:: Private residence Living Arrangements: Spouse/significant  other Available Help at Discharge: Family;Available 24 hours/day(for one week) Type of Home: House Home Access: Stairs to enter [employer] of Steps: 3         Bathroom Shower/Tub: Chief Strategy Officer: Standard     Home Equipment: None          Prior Functioning/Environment Level of Independence: Independent                 OT Problem List: Decreased strength;Decreased range of motion;Decreased activity tolerance;Impaired balance (sitting and/or standing);Decreased safety awareness;Decreased knowledge of use of DME or AE;Decreased knowledge of precautions;Pain;Impaired UE functional use      OT Treatment/Interventions:      OT Goals(Current goals can be found in the care plan section) Acute Rehab OT Goals Patient Stated Goal: to get to outpatient therapy next week OT Goal Formulation: With patient Potential to Achieve Goals: Good  OT Frequency:     Barriers to D/C:            Co-evaluation              AM-PAC PT "6 Clicks" Daily Activity     Outcome Measure Help from another person eating meals?: A Little Help from another person taking care of personal grooming?: A Little Help from another person toileting, which includes using toliet, bedpan, or urinal?: None Help from another person bathing (including washing, rinsing, drying)?: A Lot Help from another person to put on and taking off regular upper body clothing?: A Lot Help from another person to put on and taking off regular lower body clothing?: A Little 6 Click Score: 17   End of Session Equipment Utilized During Treatment: Other (comment)(R shoulder immobilizer) Nurse Communication: Mobility status  Activity Tolerance: Patient tolerated treatment well Patient left: with call bell/phone within reach;in bed(seated at EOB)  OT  Visit Diagnosis: Other abnormalities of gait and mobility (R26.89);Pain Pain - Right/Left: Right Pain - part of body: Shoulder                 Time: 2725-3664 OT Time Calculation (min): 15 min Charges:  OT General Charges $OT Visit: 1 Visit OT Evaluation $OT Eval Low Complexity: 1 Low G-Codes:     Doristine Section, MS OTR/L  Pager: [phone]   Charleene Callegari A Daequan Kozma 08/26/2017, 11:12 AM

## 2017-08-26 NOTE — Progress Notes (Signed)
PT Cancellation Note  Patient Details Name: Vicki Pena MRN: [id number] DOB: 09-02-1970   Cancelled Treatment:    Reason Eval/Treat Not Completed: PT screened, no needs identified, will sign off. Pt independent with all functional mobility. No acute care PT needs.    Ilda FoilGarrow, Hezakiah Champeau Rene 08/26/2017, 10:12 AM

## 2017-08-26 NOTE — Progress Notes (Signed)
RN gave pt discharge instructions, pt stated understanding did not have any questions. Pt left with 3 prescriptions asprin had been escribed. IV removed, pt husband had belongings. Pt discharged home

## 2017-08-26 NOTE — Care Management Note (Signed)
Case Management Note  Patient Details  Name: Vicki Pena MRN: [id number] Date of Birth: Jul 28, 1970  Subjective/Objective:                 Spoke w patient and spouse at the bedside. Patient states that she is set up for out patient PT/OT as part of her surgical plan. No HH or DME required. No CM needs.    Action/Plan:   Expected Discharge Date:  08/26/17               Expected Discharge Plan:  Home/Self Care  In-House Referral:     Discharge planning Services  CM Consult  Post Acute Care Choice:    Choice offered to:     DME Arranged:    DME Agency:     HH Arranged:    HH Agency:     Status of Service:  Completed, signed off  If discussed at [employer] Length of Stay Meetings, dates discussed:    Additional Comments:  Lawerance SabalDebbie Vilda Zollner, RN 08/26/2017, 9:21 AM

## 2017-08-28 ENCOUNTER — Encounter (HOSPITAL_COMMUNITY): Payer: Self-pay | Admitting: Orthopedic Surgery

## 2017-09-01 DIAGNOSIS — R300 Dysuria: Secondary | ICD-10-CM | POA: Diagnosis not present

## 2017-09-01 DIAGNOSIS — M25511 Pain in right shoulder: Secondary | ICD-10-CM | POA: Diagnosis not present

## 2017-09-01 DIAGNOSIS — N3001 Acute cystitis with hematuria: Secondary | ICD-10-CM | POA: Diagnosis not present

## 2017-09-01 DIAGNOSIS — M19019 Primary osteoarthritis, unspecified shoulder: Secondary | ICD-10-CM | POA: Diagnosis not present

## 2017-09-01 DIAGNOSIS — M25611 Stiffness of right shoulder, not elsewhere classified: Secondary | ICD-10-CM | POA: Diagnosis not present

## 2017-09-04 NOTE — Discharge Summary (Signed)
Patient ID: Vicki Pena A Husband MRN: [id number] DOB/AGE: Apr 29, 1970 47 y.o.  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 Complication of anesthesia   . PONV (postoperative nausea and vomiting)     Surgeries: Procedure(s): RIGHT TOTAL SHOULDER ARTHROPLASTY on 08/25/2017   Consultants:   Discharged Condition: Improved  Hospital Course: Vicki Pena is an 47 y.o. female who was admitted 08/25/2017 for operative treatment ofLocalized primary osteoarthritis of right shoulder region. Patient has severe unremitting pain that affects sleep, daily activities, and work/hobbies. After pre-op clearance the patient was taken to the operating room on 08/25/2017 and underwent  Procedure(s): RIGHT TOTAL SHOULDER ARTHROPLASTY.    Patient was given perioperative antibiotics:  Anti-infectives (From admission, onward)   Start     Dose/Rate Route Frequency Ordered Stop   08/25/17 1800  clindamycin (CLEOCIN) IVPB 600 mg     600 mg 100 mL/hr over 30 Minutes Intravenous Every 6 hours 08/25/17 1547 08/26/17 0553   08/25/17 1022  clindamycin (CLEOCIN) 900 MG/50ML IVPB    Note to Pharmacy:  Kathrene BongoSmith, Catherine   : cabinet override      08/25/17 1022 08/25/17 1230   08/25/17 1015  clindamycin (CLEOCIN) IVPB 900 mg     900 mg 100 mL/hr over 30 Minutes Intravenous On call to O.R. 08/25/17 1014 08/25/17 1300   08/25/17 0600  vancomycin (VANCOCIN) 1,500 mg in sodium chloride 0.9 % 500 mL IVPB     1,500 mg 250 mL/hr over 120 Minutes Intravenous On call to O.R. 08/24/17 1258 08/25/17 1135       Patient was given sequential compression devices, early ambulation, and chemoprophylaxis to prevent DVT.  Patient benefited maximally from hospital stay and there were no complications.    Recent  vital signs: No data found.   Recent laboratory studies: No results for input(s): WBC, HGB, HCT, PLT, NA, K, CL, CO2, BUN, CREATININE, GLUCOSE, INR, CALCIUM in the last 72 hours.  Invalid input(s): PT, 2   Discharge Medications:   Allergies as of 08/26/2017      Reactions   Amoxicillin Anaphylaxis, Swelling   Face/neck swelling fever lips turned blue      Medication List    TAKE these medications   acetaminophen 500 MG tablet Commonly known as:  TYLENOL Take 1 tablet (500 mg total) by mouth every 6 (six) hours. What changed:    how much to take  when to take this  reasons to take this   aspirin 325 MG EC tablet Take 1 tablet (325 mg total) by mouth daily.   docusate sodium 100 MG capsule Commonly known as:  COLACE Take 1 capsule (100 mg total) by mouth 2 (two) times daily.   ondansetron 4 MG disintegrating tablet Commonly known as:  ZOFRAN ODT Take 1 tablet (4 mg total) by mouth every 8 (eight) hours as needed.   oxyCODONE 5 MG immediate release tablet Commonly known as:  ROXICODONE Take 1-2 tablets (5-10 mg total) by mouth every 8 (eight) hours as needed.       Diagnostic Studies: Dg Shoulder Right Port  Result Date: 08/25/2017 CLINICAL DATA:  Osteoarthritis of the right shoulder. Status post right total shoulder prosthesis insertion. EXAM: PORTABLE RIGHT SHOULDER COMPARISON:  CT scan dated 07/26/2017 FINDINGS: Single AP portable view of the right shoulder demonstrates  the patient has undergone right total shoulder prosthesis insertion. No fracture or dislocation. IMPRESSION: Satisfactory appearance of the right shoulder in the AP projection after total shoulder prosthesis insertion. Electronically Signed   By: Francene Boyers M.D.   On: 08/25/2017 15:57    Disposition:   Discharge Instructions    Call MD / Call 911   Complete by:  As directed    If you experience chest pain or shortness of breath, CALL 911 and be transported to the hospital emergency room.  If  you develope a fever above 101 F, pus (white drainage) or increased drainage or redness at the wound, or calf pain, call your surgeon's office.   Constipation Prevention   Complete by:  As directed    Drink plenty of fluids.  Prune juice may be helpful.  You may use a stool softener, such as Colace (over the counter) 100 mg twice a day.  Use MiraLax (over the counter) for constipation as needed.   Diet - low sodium heart healthy   Complete by:  As directed    Increase activity slowly as tolerated   Complete by:  As directed       Follow-up Information    Yolonda Kida, MD In 2 weeks.   Specialty:  Orthopedic Surgery Why:  For wound re-check Contact information: [address] [place] [address] [phone]            Signed: Javier Docker 09/04/2017, 12:37 PM

## 2017-09-08 DIAGNOSIS — M25511 Pain in right shoulder: Secondary | ICD-10-CM | POA: Diagnosis not present

## 2017-09-08 DIAGNOSIS — M19019 Primary osteoarthritis, unspecified shoulder: Secondary | ICD-10-CM | POA: Diagnosis not present

## 2017-09-08 DIAGNOSIS — M25611 Stiffness of right shoulder, not elsewhere classified: Secondary | ICD-10-CM | POA: Diagnosis not present

## 2017-09-15 DIAGNOSIS — M25511 Pain in right shoulder: Secondary | ICD-10-CM | POA: Diagnosis not present

## 2017-09-15 DIAGNOSIS — M19019 Primary osteoarthritis, unspecified shoulder: Secondary | ICD-10-CM | POA: Diagnosis not present

## 2017-09-15 DIAGNOSIS — M25611 Stiffness of right shoulder, not elsewhere classified: Secondary | ICD-10-CM | POA: Diagnosis not present

## 2017-09-22 DIAGNOSIS — M25511 Pain in right shoulder: Secondary | ICD-10-CM | POA: Diagnosis not present

## 2017-09-29 DIAGNOSIS — M25511 Pain in right shoulder: Secondary | ICD-10-CM | POA: Diagnosis not present

## 2017-09-29 DIAGNOSIS — M19019 Primary osteoarthritis, unspecified shoulder: Secondary | ICD-10-CM | POA: Diagnosis not present

## 2017-09-29 DIAGNOSIS — M25611 Stiffness of right shoulder, not elsewhere classified: Secondary | ICD-10-CM | POA: Diagnosis not present

## 2017-10-06 DIAGNOSIS — Z4789 Encounter for other orthopedic aftercare: Secondary | ICD-10-CM | POA: Diagnosis not present

## 2017-10-13 DIAGNOSIS — M25511 Pain in right shoulder: Secondary | ICD-10-CM | POA: Diagnosis not present

## 2017-10-27 DIAGNOSIS — M25511 Pain in right shoulder: Secondary | ICD-10-CM | POA: Diagnosis not present

## 2017-11-03 DIAGNOSIS — M25512 Pain in left shoulder: Secondary | ICD-10-CM | POA: Diagnosis not present

## 2017-11-03 DIAGNOSIS — Z4789 Encounter for other orthopedic aftercare: Secondary | ICD-10-CM | POA: Diagnosis not present

## 2017-11-03 DIAGNOSIS — M25511 Pain in right shoulder: Secondary | ICD-10-CM | POA: Diagnosis not present

## 2018-02-06 DIAGNOSIS — M25511 Pain in right shoulder: Secondary | ICD-10-CM | POA: Diagnosis not present

## 2018-03-09 DIAGNOSIS — Z136 Encounter for screening for cardiovascular disorders: Secondary | ICD-10-CM | POA: Diagnosis not present

## 2018-03-09 DIAGNOSIS — Z23 Encounter for immunization: Secondary | ICD-10-CM | POA: Diagnosis not present

## 2018-03-09 DIAGNOSIS — Z0000 Encounter for general adult medical examination without abnormal findings: Secondary | ICD-10-CM | POA: Diagnosis not present

## 2018-03-20 ENCOUNTER — Other Ambulatory Visit: Payer: Self-pay | Admitting: Orthopedic Surgery

## 2018-03-20 DIAGNOSIS — M25611 Stiffness of right shoulder, not elsewhere classified: Secondary | ICD-10-CM | POA: Diagnosis not present

## 2018-03-29 ENCOUNTER — Other Ambulatory Visit: Payer: BLUE CROSS/BLUE SHIELD

## 2018-04-04 ENCOUNTER — Ambulatory Visit
Admission: RE | Admit: 2018-04-04 | Discharge: 2018-04-04 | Disposition: A | Discharge status: Home / Self Care | Payer: BLUE CROSS/BLUE SHIELD | Source: Ambulatory Visit | Attending: Orthopedic Surgery | Admitting: Orthopedic Surgery

## 2018-04-04 DIAGNOSIS — M67813 Other specified disorders of tendon, right shoulder: Secondary | ICD-10-CM | POA: Diagnosis not present

## 2018-04-04 DIAGNOSIS — M25611 Stiffness of right shoulder, not elsewhere classified: Secondary | ICD-10-CM

## 2018-04-19 DIAGNOSIS — M19011 Primary osteoarthritis, right shoulder: Secondary | ICD-10-CM | POA: Diagnosis not present

## 2018-04-19 DIAGNOSIS — M19012 Primary osteoarthritis, left shoulder: Secondary | ICD-10-CM | POA: Diagnosis not present

## 2018-04-27 DIAGNOSIS — J208 Acute bronchitis due to other specified organisms: Secondary | ICD-10-CM | POA: Diagnosis not present

## 2018-05-22 DIAGNOSIS — N6320 Unspecified lump in the left breast, unspecified quadrant: Secondary | ICD-10-CM | POA: Diagnosis not present

## 2018-05-24 ENCOUNTER — Other Ambulatory Visit: Payer: Self-pay | Admitting: Family Medicine

## 2018-05-24 DIAGNOSIS — N6320 Unspecified lump in the left breast, unspecified quadrant: Secondary | ICD-10-CM

## 2018-06-05 ENCOUNTER — Ambulatory Visit
Admission: RE | Admit: 2018-06-05 | Discharge: 2018-06-05 | Disposition: A | Discharge status: Home / Self Care | Payer: BLUE CROSS/BLUE SHIELD | Source: Ambulatory Visit | Attending: Family Medicine | Admitting: Family Medicine

## 2018-06-05 ENCOUNTER — Other Ambulatory Visit: Payer: Self-pay

## 2018-06-05 ENCOUNTER — Other Ambulatory Visit: Payer: Self-pay | Admitting: Family Medicine

## 2018-06-05 DIAGNOSIS — N6310 Unspecified lump in the right breast, unspecified quadrant: Secondary | ICD-10-CM

## 2018-06-05 DIAGNOSIS — N6320 Unspecified lump in the left breast, unspecified quadrant: Secondary | ICD-10-CM

## 2018-06-11 ENCOUNTER — Ambulatory Visit
Admission: RE | Admit: 2018-06-11 | Discharge: 2018-06-11 | Disposition: A | Discharge status: Home / Self Care | Payer: BLUE CROSS/BLUE SHIELD | Source: Ambulatory Visit | Attending: Family Medicine | Admitting: Family Medicine

## 2018-06-11 ENCOUNTER — Other Ambulatory Visit: Payer: Self-pay

## 2018-06-11 DIAGNOSIS — N6310 Unspecified lump in the right breast, unspecified quadrant: Secondary | ICD-10-CM

## 2018-06-11 DIAGNOSIS — N6312 Unspecified lump in the right breast, upper inner quadrant: Secondary | ICD-10-CM | POA: Diagnosis not present

## 2018-06-11 DIAGNOSIS — D241 Benign neoplasm of right breast: Secondary | ICD-10-CM | POA: Diagnosis not present

## 2018-06-11 HISTORY — PX: BREAST BIOPSY: SHX20

## 2018-06-25 ENCOUNTER — Other Ambulatory Visit: Payer: BLUE CROSS/BLUE SHIELD

## 2018-07-26 DIAGNOSIS — M19011 Primary osteoarthritis, right shoulder: Secondary | ICD-10-CM | POA: Diagnosis not present

## 2018-08-23 DIAGNOSIS — N952 Postmenopausal atrophic vaginitis: Secondary | ICD-10-CM | POA: Diagnosis not present

## 2018-08-23 DIAGNOSIS — N951 Menopausal and female climacteric states: Secondary | ICD-10-CM | POA: Diagnosis not present

## 2018-09-03 DIAGNOSIS — H9209 Otalgia, unspecified ear: Secondary | ICD-10-CM | POA: Diagnosis not present

## 2018-09-07 DIAGNOSIS — J029 Acute pharyngitis, unspecified: Secondary | ICD-10-CM | POA: Diagnosis not present

## 2018-09-07 DIAGNOSIS — N951 Menopausal and female climacteric states: Secondary | ICD-10-CM | POA: Diagnosis not present

## 2018-10-04 ENCOUNTER — Other Ambulatory Visit: Payer: Self-pay

## 2018-10-04 DIAGNOSIS — Z20822 Contact with and (suspected) exposure to covid-19: Secondary | ICD-10-CM

## 2018-10-06 LAB — NOVEL CORONAVIRUS, NAA: SARS-CoV-2, NAA: NOT DETECTED

## 2018-10-12 ENCOUNTER — Other Ambulatory Visit: Payer: Self-pay | Admitting: Orthopedic Surgery

## 2018-10-12 DIAGNOSIS — M19011 Primary osteoarthritis, right shoulder: Secondary | ICD-10-CM

## 2018-10-19 ENCOUNTER — Ambulatory Visit
Admission: RE | Admit: 2018-10-19 | Discharge: 2018-10-19 | Disposition: A | Discharge status: Home / Self Care | Payer: Self-pay | Source: Ambulatory Visit | Attending: Orthopedic Surgery | Admitting: Orthopedic Surgery

## 2018-10-19 DIAGNOSIS — M19011 Primary osteoarthritis, right shoulder: Secondary | ICD-10-CM

## 2018-11-06 DIAGNOSIS — M542 Cervicalgia: Secondary | ICD-10-CM | POA: Diagnosis not present

## 2018-11-06 DIAGNOSIS — M19011 Primary osteoarthritis, right shoulder: Secondary | ICD-10-CM | POA: Diagnosis not present

## 2018-11-07 DIAGNOSIS — M40202 Unspecified kyphosis, cervical region: Secondary | ICD-10-CM | POA: Diagnosis not present

## 2018-11-07 DIAGNOSIS — M5030 Other cervical disc degeneration, unspecified cervical region: Secondary | ICD-10-CM | POA: Diagnosis not present

## 2018-11-07 DIAGNOSIS — M5412 Radiculopathy, cervical region: Secondary | ICD-10-CM | POA: Diagnosis not present

## 2018-11-07 DIAGNOSIS — M542 Cervicalgia: Secondary | ICD-10-CM | POA: Diagnosis not present

## 2018-11-20 DIAGNOSIS — M25511 Pain in right shoulder: Secondary | ICD-10-CM | POA: Diagnosis not present

## 2018-12-18 DIAGNOSIS — R300 Dysuria: Secondary | ICD-10-CM | POA: Diagnosis not present

## 2018-12-18 DIAGNOSIS — R319 Hematuria, unspecified: Secondary | ICD-10-CM | POA: Diagnosis not present

## 2019-01-06 ENCOUNTER — Ambulatory Visit
Admission: EM | Admit: 2019-01-06 | Discharge: 2019-01-06 | Disposition: A | Discharge status: Home / Self Care | Payer: BC Managed Care – PPO | Attending: Physician Assistant | Admitting: Physician Assistant

## 2019-01-06 DIAGNOSIS — W01198A Fall on same level from slipping, tripping and stumbling with subsequent striking against other object, initial encounter: Secondary | ICD-10-CM

## 2019-01-06 DIAGNOSIS — S0181XA Laceration without foreign body of other part of head, initial encounter: Secondary | ICD-10-CM

## 2019-01-06 NOTE — Discharge Instructions (Signed)
3sutures placed. You can remove current dressing in 24 hours. Keep wound clean and dry. You can clean gently with soap and water. Do not soak area in water. Monitor for spreading redness, increased warmth, increased swelling, fever, follow up for reevaluation needed. Otherwise follow up in 5 days for suture removal.

## 2019-01-06 NOTE — ED Provider Notes (Signed)
[name]    [name]: [id number] Arrival date & time: 01/06/19  1307      History   Chief Complaint Chief Complaint  Patient presents with  . Laceration    HPI Vicki Pena is a 48 y.o. female.   48 year old female comes in for laceration to the forehead that was sustained yesterday.  States last night tripped and fell over her dog, and hit her head to the door frame.  Denies loss of consciousness.  Cleaned area with water, and bleeding discontinued on own.  States area is slightly sore, but without any significant headache.  Denies any nausea, vomiting, dizziness, trouble focusing, photophobia.  Has still been able to ambulate on own without difficulty.  Up-to-date tetanus.     Past Medical History:  Diagnosis Date  . Complication of anesthesia   . PONV (postoperative nausea and vomiting)     Patient Active Problem List   Diagnosis Date Noted  . Osteoarthritis of right shoulder 08/25/2017  . Localized primary osteoarthritis of right shoulder region 07/26/2017    Past Surgical History:  Procedure Laterality Date  . ABDOMINAL HYSTERECTOMY    . APPENDECTOMY    . CESAREAN SECTION    . FOOT SURGERY    . TONSILLECTOMY    . TOTAL SHOULDER ARTHROPLASTY Right 08/25/2017   Procedure: RIGHT TOTAL SHOULDER ARTHROPLASTY;  Surgeon: Nicholes Stairs, MD;  Location: [facility];  Service: Orthopedics;  Laterality: Right;  . TUBAL LIGATION      OB History   No obstetric history on file.      Home Medications    Prior to Admission medications   Medication Sig Start Date End Date Taking? Authorizing Provider  acetaminophen (TYLENOL) 500 MG tablet Take 1 tablet (500 mg total) by mouth every 6 (six) hours. 08/26/17  Yes Susa Day, MD  aspirin EC 325 MG EC tablet Take 1 tablet (325 mg total) by mouth daily. 08/27/17  Yes Susa Day, MD    Family History Family History  Problem Relation Age of Onset  . Diabetes Mother   . COPD Mother   . Heart disease  Father   . Heart disease Brother   . Drug abuse Brother   . Breast cancer Maternal Aunt     Social History Social History   Tobacco Use  . Smoking status: Current Every Day Smoker    Packs/day: 1.00    Types: Cigarettes  . Smokeless tobacco: Never Used  Substance Use Topics  . Alcohol use: Yes    Alcohol/week: 0.0 standard drinks    Comment: occasion  . Drug use: No     Allergies   Amoxicillin   Review of Systems Review of Systems  Reason unable to perform ROS: See HPI as above.     Physical Exam Triage Vital Signs ED Triage Vitals  Enc Vitals Group     BP 01/06/19 1324 137/90     Pulse Rate 01/06/19 1324 (!) 110     Resp 01/06/19 1324 18     Temp 01/06/19 1324 98 F (36.7 C)     Temp src --      SpO2 01/06/19 1324 100 %     Weight --      Height --      Head Circumference --      Peak Flow --      Pain Score 01/06/19 1322 0     Pain Loc --      Pain Edu? --  Excl. in GC? --    No data found.  Updated Vital Signs BP 137/90   Pulse (!) 110   Temp 98 F (36.7 C)   Resp 18   LMP 02/07/1998   SpO2 100%   Physical Exam Constitutional:      General: She is not in acute distress.    Appearance: She is well-developed. She is not diaphoretic.  HENT:     Head: Normocephalic and atraumatic.     Comments: 2.5cm vertical laceration to the forehead. No bleeding on exam. Surrounding contusion with tenderness to palpation. No crepitus felt. No swelling.  Eyes:     Extraocular Movements: Extraocular movements intact.     Conjunctiva/sclera: Conjunctivae normal.     Pupils: Pupils are equal, round, and reactive to light.  Neck:     Musculoskeletal: Normal range of motion and neck supple.  Pulmonary:     Effort: Pulmonary effort is normal. No respiratory distress.  Skin:    General: Skin is warm and dry.  Neurological:     Mental Status: She is alert and oriented to person, place, and time.     GCS: GCS eye subscore is 4. GCS verbal subscore is 5.  GCS motor subscore is 6.     Coordination: Coordination is intact.     Gait: Gait is intact.      UC Treatments / Results  Labs (all labs ordered are listed, but only abnormal results are displayed) Labs Reviewed - No data to display  EKG   Radiology No results found.  Procedures Laceration Repair  Date/Time: 01/06/2019 2:47 PM Performed by: Belinda FisherYu, Corneshia V, PA-C Authorized by: Belinda FisherYu, Juliet V, PA-C   Consent:    Consent obtained:  Verbal   Consent given by:  Patient   Risks discussed:  Infection, pain, poor cosmetic result and poor wound healing   Alternatives discussed:  No treatment and referral Anesthesia (see MAR for exact dosages):    Anesthesia method:  Local infiltration   Local anesthetic:  Lidocaine 1% WITH epi Laceration details:    Location:  Face   Face location:  Forehead   Length (cm):  2.5   Depth (mm):  7 Repair type:    Repair type:  Simple Pre-procedure details:    Preparation:  Patient was prepped and draped in usual sterile fashion Exploration:    Hemostasis achieved with:  Epinephrine and direct pressure   Wound exploration: entire depth of wound probed and visualized     Contaminated: no   Treatment:    Area cleansed with:  Hibiclens and saline   Amount of cleaning:  Standard   Irrigation solution:  Sterile saline   Irrigation method:  Pressure wash   Visualized foreign bodies/material removed: no   Skin repair:    Repair method:  Sutures   Suture size:  5-0   Suture material:  Prolene   Suture technique:  Simple interrupted   Number of sutures:  3 Approximation:    Approximation:  Close Post-procedure details:    Dressing:  Antibiotic ointment and bulky dressing   Patient tolerance of procedure:  Tolerated well, no immediate complications   (including critical care time)  Medications Ordered in UC Medications - No data to display  Initial Impression / Assessment and Plan / UC Course  I have reviewed the triage vital signs and the  nursing notes.  Pertinent labs & imaging results that were available during my care of the patient were reviewed by me and considered  in my medical decision making (see chart for details).    Discussed laceration repair options including dermaclip vs sutures. Risks and benefits discussed, patient would like to use sutures. Patient tolerated procedure well. 3 sutures placed. Wound care instructions given. Return precautions given. Otherwise, follow up in 5 days for suture removal. Patient expresses understanding and agrees to plan.   Final Clinical Impressions(s) / UC Diagnoses   Final diagnoses:  Laceration of forehead, initial encounter   ED Prescriptions    None     PDMP not reviewed this encounter.   Keyonda, Bickle, PA-C 01/06/19 1449

## 2019-01-06 NOTE — ED Triage Notes (Signed)
Pt presents with complaints of falling and tripping over her dog and hitting her head on her door frame. Denies loc. Patient has laceration to the front of her forehead, bleeding controlled.

## 2019-01-11 ENCOUNTER — Ambulatory Visit
Admission: EM | Admit: 2019-01-11 | Discharge: 2019-01-11 | Disposition: A | Discharge status: Home / Self Care | Payer: BC Managed Care – PPO

## 2019-01-11 DIAGNOSIS — Z4802 Encounter for removal of sutures: Secondary | ICD-10-CM

## 2019-01-11 NOTE — ED Triage Notes (Signed)
3 sutures removed from center of forehead. No drainage or redness noted

## 2019-02-19 DIAGNOSIS — M2540 Effusion, unspecified joint: Secondary | ICD-10-CM | POA: Diagnosis not present

## 2019-02-19 DIAGNOSIS — Z13220 Encounter for screening for lipoid disorders: Secondary | ICD-10-CM | POA: Diagnosis not present

## 2019-02-19 DIAGNOSIS — M2550 Pain in unspecified joint: Secondary | ICD-10-CM | POA: Diagnosis not present

## 2019-02-19 DIAGNOSIS — Z0000 Encounter for general adult medical examination without abnormal findings: Secondary | ICD-10-CM | POA: Diagnosis not present

## 2019-02-20 DIAGNOSIS — M25532 Pain in left wrist: Secondary | ICD-10-CM | POA: Diagnosis not present

## 2019-02-20 DIAGNOSIS — M25512 Pain in left shoulder: Secondary | ICD-10-CM | POA: Diagnosis not present

## 2019-02-20 DIAGNOSIS — M19012 Primary osteoarthritis, left shoulder: Secondary | ICD-10-CM | POA: Diagnosis not present

## 2019-02-27 DIAGNOSIS — M542 Cervicalgia: Secondary | ICD-10-CM | POA: Diagnosis not present

## 2019-03-08 DIAGNOSIS — M40202 Unspecified kyphosis, cervical region: Secondary | ICD-10-CM | POA: Diagnosis not present

## 2019-03-08 DIAGNOSIS — M5412 Radiculopathy, cervical region: Secondary | ICD-10-CM | POA: Diagnosis not present

## 2019-03-15 DIAGNOSIS — E041 Nontoxic single thyroid nodule: Secondary | ICD-10-CM | POA: Diagnosis not present

## 2019-03-19 ENCOUNTER — Other Ambulatory Visit: Payer: Self-pay | Admitting: Physician Assistant

## 2019-03-19 DIAGNOSIS — E041 Nontoxic single thyroid nodule: Secondary | ICD-10-CM

## 2019-03-20 ENCOUNTER — Other Ambulatory Visit (HOSPITAL_COMMUNITY)
Admission: RE | Admit: 2019-03-20 | Discharge: 2019-03-20 | Disposition: A | Discharge status: Home / Self Care | Payer: BC Managed Care – PPO | Source: Ambulatory Visit | Attending: Radiology | Admitting: Radiology

## 2019-03-20 ENCOUNTER — Ambulatory Visit
Admission: RE | Admit: 2019-03-20 | Discharge: 2019-03-20 | Disposition: A | Discharge status: Home / Self Care | Payer: BC Managed Care – PPO | Source: Ambulatory Visit | Attending: Physician Assistant | Admitting: Physician Assistant

## 2019-03-20 DIAGNOSIS — E041 Nontoxic single thyroid nodule: Secondary | ICD-10-CM | POA: Diagnosis not present

## 2019-03-22 LAB — CYTOLOGY - NON PAP

## 2019-04-03 ENCOUNTER — Other Ambulatory Visit: Payer: Self-pay | Admitting: Orthopedic Surgery

## 2019-04-03 DIAGNOSIS — M19011 Primary osteoarthritis, right shoulder: Secondary | ICD-10-CM | POA: Diagnosis not present

## 2019-04-09 ENCOUNTER — Other Ambulatory Visit: Payer: Self-pay

## 2019-04-09 ENCOUNTER — Ambulatory Visit
Admission: RE | Admit: 2019-04-09 | Discharge: 2019-04-09 | Disposition: A | Discharge status: Home / Self Care | Payer: BC Managed Care – PPO | Source: Ambulatory Visit | Attending: Orthopedic Surgery | Admitting: Orthopedic Surgery

## 2019-04-09 DIAGNOSIS — M19011 Primary osteoarthritis, right shoulder: Secondary | ICD-10-CM | POA: Diagnosis not present

## 2019-04-09 DIAGNOSIS — M25511 Pain in right shoulder: Secondary | ICD-10-CM | POA: Diagnosis not present

## 2019-04-09 LAB — SYNOVIAL CELL COUNT + DIFF, W/ CRYSTALS
Basophils, %: 0 %
Eosinophils-Synovial: 1 % (ref 0–2)
Lymphocytes-Synovial Fld: 30 % (ref 0–74)
Monocyte/Macrophage: 13 % (ref 0–69)
Neutrophil, Synovial: 56 % — ABNORMAL HIGH (ref 0–24)
Synoviocytes, %: 0 % (ref 0–15)
WBC, Synovial: 412 cells/uL — ABNORMAL HIGH (ref <150)

## 2019-04-22 LAB — ANAEROBIC AND AEROBIC CULTURE
AER RESULT:: NO GROWTH
GRAM STAIN:: NONE SEEN
MICRO NUMBER:: 10204458
MICRO NUMBER:: 10204459
SPECIMEN QUALITY:: ADEQUATE
SPECIMEN QUALITY:: ADEQUATE

## 2019-04-30 DIAGNOSIS — M5412 Radiculopathy, cervical region: Secondary | ICD-10-CM | POA: Diagnosis not present

## 2019-05-01 DIAGNOSIS — E041 Nontoxic single thyroid nodule: Secondary | ICD-10-CM | POA: Diagnosis not present

## 2019-05-10 DIAGNOSIS — M25511 Pain in right shoulder: Secondary | ICD-10-CM | POA: Diagnosis not present

## 2019-06-07 DIAGNOSIS — M25511 Pain in right shoulder: Secondary | ICD-10-CM | POA: Diagnosis not present

## 2019-08-27 DIAGNOSIS — M25511 Pain in right shoulder: Secondary | ICD-10-CM | POA: Diagnosis not present

## 2019-09-17 DIAGNOSIS — M25511 Pain in right shoulder: Secondary | ICD-10-CM | POA: Diagnosis not present

## 2019-09-24 ENCOUNTER — Ambulatory Visit: Payer: BC Managed Care – PPO | Admitting: Neurology

## 2019-10-15 ENCOUNTER — Ambulatory Visit: Payer: BC Managed Care – PPO | Admitting: Neurology

## 2019-11-05 ENCOUNTER — Encounter: Payer: Self-pay | Admitting: *Deleted

## 2019-11-06 ENCOUNTER — Ambulatory Visit: Payer: BC Managed Care – PPO | Admitting: Neurology

## 2019-11-06 ENCOUNTER — Encounter: Payer: Self-pay | Admitting: Neurology

## 2019-11-06 VITALS — BP 130/88 | HR 93 | Ht 64.0 in | Wt 149.0 lb

## 2019-11-06 DIAGNOSIS — G8929 Other chronic pain: Secondary | ICD-10-CM | POA: Diagnosis not present

## 2019-11-06 DIAGNOSIS — M25511 Pain in right shoulder: Secondary | ICD-10-CM

## 2019-11-06 DIAGNOSIS — R202 Paresthesia of skin: Secondary | ICD-10-CM

## 2019-11-06 DIAGNOSIS — R200 Anesthesia of skin: Secondary | ICD-10-CM | POA: Insufficient documentation

## 2019-11-06 MED ORDER — DULOXETINE HCL 30 MG PO CPEP: 30.0000 mg | ORAL_CAPSULE | Freq: Every day | ORAL | 3 refills | Status: DC

## 2019-11-06 NOTE — Progress Notes (Signed)
Chief Complaint  Patient presents with  . New Patient (Initial Visit)    Referred for cervical radiculopathy. Reports neck pain along with intermittent numbness/tingling in first three fingers. She uses ibuprofen 800mg  prn.   . Orthopaedics    , MD  . 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  I reviewed and summarized the referring note.  Around 2014, she has developed gradual onset right shoulder pain, it was surrounding her right upper humeral region, gradually getting worse, also had limited range of motion, to the point of difficulty lifting her right shoulder, eventually had a right shoulder replacement in July 2019 by Dr. August 2019, postsurgically, she was doing really good from October 2019 to February 2020  Then she began to have different kind of shoulder pain, this time it is centered around right pectoralis major tendon insertion point to upper humeral region, tenderness upon deep palpitation, over the past few months, she has tried physical therapy, joint injection, aspiration, without helping her symptoms, it progressively worsened, had a CT of the right shoulder without contrast October 19, 2018, impacted humeral and glenoid prosthesis without obvious CT complicating features, intact AC joints, grossly normal appearance of rotator cuff tendons  Per patient, there was recent discovery of replaced right shoulder joints issues, planning on have second surgery  Since 2020, she also developed right neck pain, radiating pain to right upper trapezius shoulder region, especially at the end of the day, she has to modify her posturing to complete her 10 hours job as a 2021, at the end of the day, she felt right shoulder tension, deep achy pain, also intermittent  numbness tingling involving right fingers, especially the first 3 fingers  I personally reviewed MRI of cervical spine from [facility] in January 2021, multilevel degenerative changes, moderate reversal of normal cervical lordosis, apex of curvature centered at C5, degenerative grade 1 anterolisthesis of C3 on C4, minimal anterolisthesis of C4 on C5, variable degree of foraminal narrowing, most obvious at C6-7, with severe left, moderately severe right foraminal narrowing, mild canal stenosis, but no evidence of cord compression.  She denies left upper extremity symptoms, denies gait abnormality, denies bowel bladder incontinence.  REVIEW OF SYSTEMS: Full 14 system review of systems performed and notable only for as above All other review of systems were negative.  ALLERGIES: Allergies  Allergen Reactions  . Amoxicillin Anaphylaxis and Swelling    Face/neck swelling fever lips turned blue  . Penicillins Anaphylaxis and Swelling    Face/neck swelling fever lips turned blue     HOME MEDICATIONS: Current Outpatient Medications  Medication Sig Dispense Refill  . ibuprofen (ADVIL) 800 MG tablet Take 800 mg by mouth as needed.      No current facility-administered medications for this visit.    PAST MEDICAL HISTORY: Past Medical History:  Diagnosis Date  . Cervical kyphosis   . Cervical radiculopathy   . Chronic neck pain   . Complication of anesthesia   . Osteoarthritis, shoulder   . PONV (postoperative nausea and vomiting)     PAST SURGICAL HISTORY: Past Surgical History:  Procedure Laterality Date  . ABDOMINAL HYSTERECTOMY    . APPENDECTOMY    . CESAREAN SECTION    . FOOT SURGERY    . TONSILLECTOMY    .  TOTAL SHOULDER ARTHROPLASTY Right 08/25/2017   Procedure: RIGHT TOTAL SHOULDER ARTHROPLASTY;  Surgeon: Yolonda Kida, MD;  Location: [facility];  Service: Orthopedics;  Laterality: Right;  . TUBAL LIGATION      FAMILY HISTORY: Family History  Problem Relation Age  of Onset  . Diabetes Mother   . COPD Mother   . Heart disease Father   . Arthritis Father   . Heart disease Brother   . Drug abuse Brother   . Breast cancer Maternal Aunt     SOCIAL HISTORY: Social History   Socioeconomic History  . Marital status: Married    Spouse name: Not on file  . Number of children: 3  . Years of education: college  . Highest education level: Not on file  Occupational History  . Occupation: hairdresser  Tobacco Use  . Smoking status: Current Every Day Smoker    Packs/day: 1.00    Types: Cigarettes  . Smokeless tobacco: Never Used  Vaping Use  . Vaping Use: Never used  Substance and Sexual Activity  . Alcohol use: Yes    Alcohol/week: 0.0 standard drinks    Comment: occasional  . Drug use: No  . Sexual activity: Not on file  Other Topics Concern  . Not on file  Social History Narrative   Right-handed.   Lives at home with family.   2-3 cups caffeine per day.   Social Determinants of Health   Financial Resource Strain:   . Difficulty of Paying Living Expenses: Not on file  Food Insecurity:   . Worried About Programme researcher, broadcasting/film/video in the Last Year: Not on file  . Ran Out of Food in the Last Year: Not on file  Transportation Needs:   . Lack of Transportation (Medical): Not on file  . Lack of Transportation (Non-Medical): Not on file  Physical Activity:   . Days of Exercise per Week: Not on file  . Minutes of Exercise per Session: Not on file  Stress:   . Feeling of Stress : Not on file  Social Connections:   . Frequency of Communication with Friends and Family: Not on file  . Frequency of Social Gatherings with Friends and Family: Not on file  . Attends Religious Services: Not on file  . Active Member of Clubs or Organizations: Not on file  . Attends Banker Meetings: Not on file  . Marital Status: Not on file  Intimate Partner Violence:   . Fear of Current or Ex-Partner: Not on file  . Emotionally Abused: Not on file  .  Physically Abused: Not on file  . Sexually Abused: Not on file     PHYSICAL EXAM   Vitals:   11/06/19 1037  BP: 130/88  Pulse: 93  Weight: 149 lb (67.6 kg)  Height: 5\' 4"  (1.626 m)   Not recorded     Body mass index is 25.58 kg/m.  PHYSICAL EXAMNIATION:  Gen: NAD, conversant, well nourised, well groomed                     Cardiovascular: Regular rate rhythm, no peripheral edema, warm, nontender. Eyes: Conjunctivae clear without exudates or hemorrhage Neck: Supple, no carotid bruits. Pulmonary: Clear to auscultation bilaterally   NEUROLOGICAL EXAM:  MENTAL STATUS: Speech:    Speech is normal; fluent and spontaneous with normal comprehension.  Cognition:     Orientation to time, place and person     Normal recent and remote memory  Normal Attention span and concentration     Normal Language, naming, repeating,spontaneous speech     Fund of knowledge   CRANIAL NERVES: CN II: Visual fields are full to confrontation. Pupils are round equal and briskly reactive to light. CN III, IV, VI: extraocular movement are normal. No ptosis. CN V: Facial sensation is intact to light touch CN VII: Face is symmetric with normal eye closure  CN VIII: Hearing is normal to causal conversation. CN IX, X: Phonation is normal. CN XI: Head turning and shoulder shrug are intact  MOTOR: Limited range of motion of right shoulder, significant tenderness of the right pectoralis major insertion point to upper humerus, there was no significant right upper extremity proximal or distal muscle weakness, rest of the limb muscle strength was normal  REFLEXES: Reflexes are 2+ and symmetric at the biceps, triceps, knees, and ankles. Plantar responses are flexor.  SENSORY: Intact to light touch, pinprick and vibratory sensation are intact in fingers and toes.  COORDINATION: There is no trunk or limb dysmetria noted.  GAIT/STANCE: Posture is normal. Gait is steady with normal steps, base, arm  swing, and turning. Heel and toe walking are normal. Tandem gait is normal.  Romberg is absent.   DIAGNOSTIC DATA (LABS, IMAGING, TESTING) - I reviewed patient records, labs, notes, testing and imaging myself where available.   ASSESSMENT AND PLAN  Detrice A Salonga is a 49 y.o. female    History of right shoulder replacement, with recurrent right shoulder pain, limited range of motion, Neck pain, radiating pain to right shoulder Intermittent right hand paresthesia  MRI of cervical in January 2021 showed multilevel degenerative changes, moderate reversal of normal cervical lordosis, apex of curvature centered at C5, degenerative grade 1 anterolisthesis of C3 on C4, minimal anterolisthesis of C4 on C5, variable degree of foraminal narrowing, most obvious at C6-7, with severe left, moderately severe right foraminal narrowing, mild canal stenosis, but no evidence of cord compression.  Differentiation diagnosis include right cervical radiculopathy, versus right upper extremity neuropathy  EMG nerve conduction study  Try Cymbalta 30 mg daily  Levert Feinstein, M.D. Ph.D.  [facility] [address], [address] [place], [place] [address] Ph: [phone] Fax: [phone]  CC:  Wilfrid Lund, [place] [address] [place],  [place] [address]

## 2019-12-11 ENCOUNTER — Encounter (INDEPENDENT_AMBULATORY_CARE_PROVIDER_SITE_OTHER): Payer: BC Managed Care – PPO | Admitting: Neurology

## 2019-12-11 ENCOUNTER — Ambulatory Visit (INDEPENDENT_AMBULATORY_CARE_PROVIDER_SITE_OTHER): Payer: BC Managed Care – PPO | Admitting: Neurology

## 2019-12-11 DIAGNOSIS — Z0289 Encounter for other administrative examinations: Secondary | ICD-10-CM

## 2019-12-11 DIAGNOSIS — M25511 Pain in right shoulder: Secondary | ICD-10-CM

## 2019-12-11 DIAGNOSIS — R200 Anesthesia of skin: Secondary | ICD-10-CM

## 2019-12-11 DIAGNOSIS — G8929 Other chronic pain: Secondary | ICD-10-CM

## 2019-12-11 NOTE — Procedures (Signed)
Full Name: Shalice Woodring Gender: Female MRN #: [id number] Date of Birth: 07/29/1970    Visit Date: 12/11/2019 08:09 Age: 49 Years Examining Physician: Levert Feinstein, MD  Referring Physician: Levert Feinstein, MD History: 49 year old female with history of right shoulder replacement, complains of recurrent right shoulder pain.  Summary of the test: Nerve conduction study: Bilateral median sensory responses showed mildly prolonged peak latency with normal snap amplitude.  Bilateral median motor responses were normal.  Right ulnar sensory and motor responses were normal.  Electromyography: Selected needle examination of the right upper extremity and right cervical paraspinal muscles were normal.  Conclusion: This is a mild abnormal study.  There is electrodiagnostic study of mild median neuropathy across the wrist consistent with mild bilateral carpal tunnel syndrome.  There is no evidence of right cervical radiculopathy.    ------------------------------- Levert Feinstein, M.D. PhD  [facility] [address] [place], [place] [address] Tel: [phone] Fax: [phone]  Verbal informed consent was obtained from the patient, patient was informed of potential risk of procedure, including bruising, bleeding, hematoma formation, infection, muscle weakness, muscle pain, numbness, among others.         MNC    Nerve / Sites Muscle Latency Ref. Amplitude Ref. Rel Amp Segments Distance Velocity Ref. Area    ms ms mV mV %  cm m/s m/s mVms  R Median - APB     Wrist APB 4.2 ?4.4 8.2 ?4.0 100 Wrist - APB 7   40.1     Upper arm APB 8.3  7.9  96.5 Upper arm - Wrist 21 51 ?49 39.3  L Median - APB     Wrist APB 3.9 ?4.4 8.1 ?4.0 100 Wrist - APB 7   38.2     Upper arm APB 7.8  7.8  96.5 Upper arm - Wrist 20 51 ?49 35.8  R Ulnar - ADM     Wrist ADM 2.8 ?3.3 10.1 ?6.0 100 Wrist - ADM 7   29.2     B.Elbow ADM 6.1  9.0  89.5 B.Elbow - Wrist 19 57 ?49 26.9     A.Elbow ADM 8.0  9.9  110  A.Elbow - B.Elbow 10 53 ?49 29.0         A.Elbow - Wrist               SNC    Nerve / Sites Rec. Site Peak Lat Ref.  Amp Ref. Segments Distance    ms ms V V  cm  R Median - Orthodromic (Dig II, Mid palm)     Dig II Wrist 3.7 ?3.4 18 ?10 Dig II - Wrist 13  L Median - Orthodromic (Dig II, Mid palm)     Dig II Wrist 3.5 ?3.4 20 ?10 Dig II - Wrist 13  R Ulnar - Orthodromic, (Dig V, Mid palm)     Dig V Wrist 2.7 ?3.1 12 ?5 Dig V - Wrist 54           F  Wave    Nerve F Lat Ref.   ms ms  R Ulnar - ADM 28.3 ?32.0       EMG Summary Table    Spontaneous MUAP Recruitment  Muscle IA Fib PSW Fasc Other Amp Dur. Poly Pattern  R. First dorsal interosseous Normal None None None _______ Normal Normal Normal Normal  R. Pronator teres Normal None None None _______ Normal Normal Normal Normal  R. Deltoid Normal None None None  _______ Normal Normal Normal Normal  R. Biceps brachii Normal None None None _______ Normal Normal Normal Normal  R. Triceps brachii Normal None None None _______ Normal Normal Normal Normal  R. Extensor digitorum communis Normal None None None _______ Normal Normal Normal Normal  R. Cervical paraspinals Normal None None None _______ Normal Normal Normal Normal

## 2019-12-17 DIAGNOSIS — M19012 Primary osteoarthritis, left shoulder: Secondary | ICD-10-CM | POA: Diagnosis not present

## 2019-12-17 DIAGNOSIS — M25511 Pain in right shoulder: Secondary | ICD-10-CM | POA: Diagnosis not present

## 2019-12-22 ENCOUNTER — Ambulatory Visit
Admission: EM | Admit: 2019-12-22 | Discharge: 2019-12-22 | Disposition: A | Discharge status: Home / Self Care | Payer: BC Managed Care – PPO | Attending: Emergency Medicine | Admitting: Emergency Medicine

## 2019-12-22 ENCOUNTER — Other Ambulatory Visit: Payer: Self-pay

## 2019-12-22 ENCOUNTER — Encounter: Payer: Self-pay | Admitting: Emergency Medicine

## 2019-12-22 DIAGNOSIS — N3001 Acute cystitis with hematuria: Secondary | ICD-10-CM | POA: Diagnosis not present

## 2019-12-22 LAB — POCT URINALYSIS DIP (MANUAL ENTRY)
Bilirubin, UA: NEGATIVE
Glucose, UA: NEGATIVE mg/dL
Ketones, POC UA: NEGATIVE mg/dL
Nitrite, UA: NEGATIVE
Spec Grav, UA: 1.01 (ref 1.010–1.025)
Urobilinogen, UA: 0.2 E.U./dL
pH, UA: 6 (ref 5.0–8.0)

## 2019-12-22 MED ORDER — PHENAZOPYRIDINE HCL 200 MG PO TABS: 200.0000 mg | ORAL_TABLET | Freq: Three times a day (TID) | ORAL | 0 refills | Status: DC

## 2019-12-22 MED ORDER — NITROFURANTOIN MONOHYD MACRO 100 MG PO CAPS: 100.0000 mg | ORAL_CAPSULE | Freq: Two times a day (BID) | ORAL | 0 refills | Status: DC

## 2019-12-22 NOTE — ED Provider Notes (Signed)
[name]    [name]: [id number] Arrival date & time: 12/22/19  9485      History   Chief Complaint Chief Complaint  Patient presents with  . Dysuria    HPI Vicki A Pena is a 49 y.o. female  Vicki Pena is a 49 y.o. female who complains of dysuria, frequency and urgency for 2 days.  Patient denies back pain, stomach ache and vaginal discharge.  Patient does have a history of UTI.  Patient does not have a history of pyelonephritis. The following portions of the patient's history were reviewed and updated as appropriate: allergies, current medications, past family history, past medical history, past social history, past surgical history and problem list.     Past Medical History:  Diagnosis Date  . Cervical kyphosis   . Cervical radiculopathy   . Chronic neck pain   . Complication of anesthesia   . Osteoarthritis, shoulder   . PONV (postoperative nausea and vomiting)     Patient Active Problem List   Diagnosis Date Noted  . Numbness and tingling in right hand 11/06/2019  . Chronic right shoulder pain 11/06/2019  . Osteoarthritis of right shoulder 08/25/2017  . Localized primary osteoarthritis of right shoulder region 07/26/2017    Past Surgical History:  Procedure Laterality Date  . ABDOMINAL HYSTERECTOMY    . APPENDECTOMY    . CESAREAN SECTION    . FOOT SURGERY    . TONSILLECTOMY    . TOTAL SHOULDER ARTHROPLASTY Right 08/25/2017   Procedure: RIGHT TOTAL SHOULDER ARTHROPLASTY;  Surgeon: Yolonda Kida, MD;  Location: [facility] OR;  Service: Orthopedics;  Laterality: Right;  . TUBAL LIGATION      OB History   No obstetric history on file.      Home Medications    Prior to Admission medications   Medication Sig Start Date End Date Taking? Authorizing Provider  DULoxetine (CYMBALTA) 30 MG capsule Take 1 capsule (30 mg total) by mouth daily. 11/06/19   Levert Feinstein, MD  ibuprofen (ADVIL) 800 MG tablet Take 800 mg by mouth as needed.     [provider]  nitrofurantoin, macrocrystal-monohydrate, (MACROBID) 100 MG capsule Take 1 capsule (100 mg total) by mouth 2 (two) times daily. 12/22/19   Hall-Potvin, [place], PA-C    Family History Family History  Problem Relation Age of Onset  . Diabetes Mother   . COPD Mother   . Heart disease Father   . Arthritis Father   . Heart disease Brother   . Drug abuse Brother   . Breast cancer Maternal Aunt     Social History Social History   Tobacco Use  . Smoking status: Current Every Day Smoker    Packs/day: 1.00    Types: Cigarettes  . Smokeless tobacco: Never Used  Vaping Use  . Vaping Use: Never used  Substance Use Topics  . Alcohol use: Yes    Alcohol/week: 0.0 standard drinks    Comment: occasional  . Drug use: No     Allergies   Amoxicillin and Penicillins   Review of Systems Review of Systems  Constitutional: Negative for fatigue and fever.  Respiratory: Negative for cough and shortness of breath.   Cardiovascular: Negative for chest pain and palpitations.  Gastrointestinal: Negative for constipation and diarrhea.  Genitourinary: Positive for dysuria, frequency and urgency. Negative for flank pain, hematuria, pelvic pain, vaginal bleeding, vaginal discharge and vaginal pain.     Physical Exam Triage Vital Signs ED Triage Vitals  Enc  Vitals Group     BP 12/22/19 0843 (!) 149/90     Pulse Rate 12/22/19 0843 92     Resp 12/22/19 0843 18     Temp 12/22/19 0843 97.9 F (36.6 C)     Temp Source 12/22/19 0843 Oral     SpO2 12/22/19 0843 97 %     Weight --      Height --      Head Circumference --      Peak Flow --      Pain Score 12/22/19 0844 5     Pain Loc --      Pain Edu? --      Excl. in GC? --    No data found.  Updated Vital Signs BP (!) 149/90 (BP Location: Left Arm)   Pulse 92   Temp 97.9 F (36.6 C) (Oral)   Resp 18   LMP 02/07/1998   SpO2 97%   Visual Acuity Right Eye Distance:   Left Eye Distance:   Bilateral Distance:     Right Eye Near:   Left Eye Near:    Bilateral Near:     Physical Exam Constitutional:      General: She is not in acute distress. HENT:     Head: Normocephalic and atraumatic.  Eyes:     General: No scleral icterus.    Pupils: Pupils are equal, round, and reactive to light.  Cardiovascular:     Rate and Rhythm: Normal rate.  Pulmonary:     Effort: Pulmonary effort is normal.  Abdominal:     General: Bowel sounds are normal.     Palpations: Abdomen is soft.     Tenderness: There is no abdominal tenderness. There is no right CVA tenderness, left CVA tenderness or guarding.  Skin:    Coloration: Skin is not jaundiced or pale.  Neurological:     Mental Status: She is alert and oriented to person, place, and time.      UC Treatments / Results  Labs (all labs ordered are listed, but only abnormal results are displayed) Labs Reviewed  POCT URINALYSIS DIP (MANUAL ENTRY) - Abnormal; Notable for the following components:      Result Value   Clarity, UA cloudy (*)    Blood, UA large (*)    Protein Ur, POC trace (*)    Leukocytes, UA Large (3+) (*)    All other components within normal limits  URINE CULTURE    EKG   Radiology No results found.  Procedures Procedures (including critical care time)  Medications Ordered in UC Medications - No data to display  Initial Impression / Assessment and Plan / UC Course  I have reviewed the triage vital signs and the nursing notes.  Pertinent labs & imaging results that were available during my care of the patient were reviewed by me and considered in my medical decision making (see chart for details).     Urine as above, culture pending.  Will tx for UTI given H&P.  Return precautions discussed, pt verbalized understanding and is agreeable to plan. Final Clinical Impressions(s) / UC Diagnoses   Final diagnoses:  Acute cystitis with hematuria     Discharge Instructions     Take antibiotic twice daily with food.  Important to drink plenty of water throughout the day. May take Azo as needed for burning sensation. Return for worsening urinary symptoms, blood in urine, abdominal or back pain, fever.    ED Prescriptions    Medication  Sig Dispense Auth. Provider   nitrofurantoin, macrocrystal-monohydrate, (MACROBID) 100 MG capsule Take 1 capsule (100 mg total) by mouth 2 (two) times daily. 10 capsule Hall-Potvin, [place], PA-C     PDMP not reviewed this encounter.   Odette Fraction [place], [place] 12/22/19 [phone]

## 2019-12-22 NOTE — ED Triage Notes (Signed)
Pt sts dysuria x 2 days with hx of similar with UTI in past

## 2019-12-22 NOTE — Discharge Instructions (Addendum)
Take antibiotic twice daily with food. °Important to drink plenty of water throughout the day. °May take Azo as needed for burning sensation. °Return for worsening urinary symptoms, blood in urine, abdominal or back pain, fever. °

## 2019-12-25 LAB — URINE CULTURE: Culture: >=100000 — AB

## 2020-01-06 NOTE — Patient Instructions (Addendum)
DUE TO COVID-19 ONLY ONE VISITOR IS ALLOWED TO COME WITH YOU AND STAY IN THE WAITING ROOM ONLY DURING PRE OP AND PROCEDURE DAY OF SURGERY. THE 1 VISITOR  MAY VISIT WITH YOU AFTER SURGERY IN YOUR PRIVATE ROOM DURING VISITING HOURS ONLY!  YOU NEED TO HAVE A COVID 19 TEST ON__12/7_____ @_8 :30______, THIS TEST MUST BE DONE BEFORE SURGERY,  COVID TESTING SITE [address] [place] , IT IS ON THE RIGHT GOING OUT [address] APPROXIMATELY  2 MINUTES PAST [employer] ON THE RIGHT. ONCE YOUR COVID TEST IS COMPLETED,  PLEASE BEGIN THE QUARANTINE INSTRUCTIONS AS OUTLINED IN YOUR HANDOUT.                Mariselda A Beever    Your procedure is scheduled on: 01/17/20   Report to [facility] Main  Entrance   Report to admitting at   10:30 AM     Call this number if you have problems the morning of surgery [phone]    BRUSH YOUR TEETH MORNING OF SURGERY AND RINSE YOUR MOUTH OUT, NO CHEWING GUM CANDY OR MINTS.   No food after midnight.    You may have clear liquid until 10:00 AM.    CLEAR LIQUID DIET   Foods Allowed                                                                     Foods Excluded  Coffee and tea, regular and decaf                             liquids that you cannot  Plain Jell-O any favor except red or purple                                           see through such as: Fruit ices (not with fruit pulp)                                     milk, soups, orange juice  Iced Popsicles                                    All solid food Carbonated beverages, regular and diet                                    Cranberry, grape and apple juices Sports drinks like Gatorade Lightly seasoned clear broth or consume(fat free) Sugar, honey syrup    At 9:30 AM drink pre surgery drink.   Nothing by mouth after 10:00  AM.   Take these medicines the morning of surgery with A SIP OF WATER: none                                 You may not  have any  metal on your body including hair pins and              piercings  Do not wear jewelry, make-up, lotions, powders or perfumes, deodorant             Do not wear nail polish on your fingernails.  Do not shave  48 hours prior to surgery.  .   Do not bring valuables to the hospital. [facility] IS NOT             RESPONSIBLE   FOR VALUABLES.  Contacts, dentures or bridgework may not be worn into surgery.       Patients discharged the day of surgery will not be allowed to drive home  . IF YOU ARE HAVING SURGERY AND GOING HOME THE SAME DAY, YOU MUST HAVE AN ADULT TO DRIVE YOU HOME AND BE WITH YOU FOR 24 HOURS.  YOU MAY GO HOME BY TAXI OR UBER OR ORTHERWISE, BUT AN ADULT MUST ACCOMPANY YOU HOME AND STAY WITH YOU FOR 24 HOURS.  Name and phone number of your driver:  Special Instructions: N/A              Please read over the following fact sheets you were given: _____________________________________________________________________             [facility]- Preparing for Total Shoulder Arthroplasty    Before surgery, you can play an important role. Because skin is not sterile, your skin needs to be as free of germs as possible. You can reduce the number of germs on your skin by using the following products. . Benzoyl Peroxide Gel o Reduces the number of germs present on the skin o Applied twice a day to shoulder area starting two days before surgery    ==================================================================  Please follow these instructions carefully:  BENZOYL PEROXIDE 5% GEL  Please do not use if you have an allergy to benzoyl peroxide.   If your skin becomes reddened/irritated stop using the benzoyl peroxide.  Starting two days before surgery, apply as follows: 1. Apply benzoyl peroxide in the morning and at night. Apply after taking a shower. If you are not taking a shower clean entire shoulder front, back, and side along with the armpit with a clean wet  washcloth.  2. Place a quarter-sized dollop on your shoulder and rub in thoroughly, making sure to cover the front, back, and side of your shoulder, along with the armpit.   2 days before ____ AM   ____ PM              1 day before ____ AM   ____ PM                         3. Do this twice a day for two days.  (Last application is the night before surgery, AFTER using the CHG soap as described below).  4. Do NOT apply benzoyl peroxide gel on the day of surgery.   [facility] - Preparing for Surgery  Before surgery, you can play an important role.  Because skin is not sterile, your skin needs to be as free of germs as possible.   You can reduce the number of germs on your skin by washing with CHG (chlorahexidine gluconate) soap before surgery.   CHG is an antiseptic cleaner which kills germs and bonds with the skin to continue killing germs even after washing. Please DO NOT use if  you have an allergy to CHG or antibacterial soaps.   If your skin becomes reddened/irritated stop using the CHG and inform your nurse when you arrive at Short Stay. Do not shave (including legs and underarms) for at least 48 hours prior to the first CHG shower.    Please follow these instructions carefully:  1.  Shower with CHG Soap the night before surgery and the  morning of Surgery.  2.  If you choose to wash your hair, wash your hair first as usual with your  normal  shampoo.  3.  After you shampoo, rinse your hair and body thoroughly to remove the  shampoo.                                        4.  Use CHG as you would any other liquid soap.  You can apply chg directly  to the skin and wash                       Gently with a scrungie or clean washcloth.  5.  Apply the CHG Soap to your body ONLY FROM THE NECK DOWN.   Do not use on face/ open                           Wound or open sores. Avoid contact with eyes, ears mouth and genitals (private parts).                       Wash face,  Genitals (private  parts) with your normal soap.             6.  Wash thoroughly, paying special attention to the area where your surgery  will be performed.  7.  Thoroughly rinse your body with warm water from the neck down.  8.  DO NOT shower/wash with your normal soap after using and rinsing off  the CHG Soap.             9.  Pat yourself dry with a clean towel.            10.  Wear clean pajamas.            11.  Place clean sheets on your bed the night of your first shower and do not  sleep with pets. Day of Surgery : Do not apply any lotions/deodorants the morning of surgery.  Please wear clean clothes to the hospital/surgery center.  FAILURE TO FOLLOW THESE INSTRUCTIONS MAY RESULT IN THE CANCELLATION OF YOUR SURGERY PATIENT SIGNATURE_________________________________  NURSE SIGNATURE__________________________________  ________________________________________________________________________

## 2020-01-08 ENCOUNTER — Other Ambulatory Visit: Payer: Self-pay

## 2020-01-08 ENCOUNTER — Encounter: Payer: Self-pay | Admitting: Emergency Medicine

## 2020-01-08 ENCOUNTER — Telehealth: Payer: Self-pay | Admitting: Emergency Medicine

## 2020-01-08 ENCOUNTER — Encounter (HOSPITAL_COMMUNITY)
Admission: RE | Admit: 2020-01-08 | Discharge: 2020-01-08 | Disposition: A | Discharge status: Home / Self Care | Payer: BC Managed Care – PPO | Source: Ambulatory Visit | Attending: Orthopedic Surgery | Admitting: Orthopedic Surgery

## 2020-01-08 ENCOUNTER — Encounter (HOSPITAL_COMMUNITY): Payer: Self-pay

## 2020-01-08 ENCOUNTER — Ambulatory Visit
Admission: EM | Admit: 2020-01-08 | Discharge: 2020-01-08 | Disposition: A | Discharge status: Home / Self Care | Payer: BC Managed Care – PPO | Attending: Emergency Medicine | Admitting: Emergency Medicine

## 2020-01-08 DIAGNOSIS — N390 Urinary tract infection, site not specified: Secondary | ICD-10-CM | POA: Insufficient documentation

## 2020-01-08 DIAGNOSIS — Z01812 Encounter for preprocedural laboratory examination: Secondary | ICD-10-CM | POA: Diagnosis not present

## 2020-01-08 LAB — CBC
HCT: 47.9 % — ABNORMAL HIGH (ref 36.0–46.0)
Hemoglobin: 15.8 g/dL — ABNORMAL HIGH (ref 12.0–15.0)
MCH: 32.2 pg (ref 26.0–34.0)
MCHC: 33 g/dL (ref 30.0–36.0)
MCV: 97.6 fL (ref 80.0–100.0)
Platelets: 344 10*3/uL (ref 150–400)
RBC: 4.91 MIL/uL (ref 3.87–5.11)
RDW: 13 % (ref 11.5–15.5)
WBC: 9.9 10*3/uL (ref 4.0–10.5)
nRBC: 0 % (ref 0.0–0.2)

## 2020-01-08 LAB — POCT URINALYSIS DIP (MANUAL ENTRY)
Glucose, UA: 100 mg/dL — AB
Nitrite, UA: POSITIVE — AB
Protein Ur, POC: >=300 mg/dL — AB
Spec Grav, UA: 1.015 (ref 1.010–1.025)
Urobilinogen, UA: 4 E.U./dL — AB
pH, UA: 5 (ref 5.0–8.0)

## 2020-01-08 LAB — SURGICAL PCR SCREEN
MRSA, PCR: NEGATIVE
Staphylococcus aureus: NEGATIVE

## 2020-01-08 MED ORDER — LEVOFLOXACIN 250 MG PO TABS: 250.0000 mg | ORAL_TABLET | Freq: Every day | ORAL | 0 refills | Status: AC

## 2020-01-08 MED ORDER — PHENAZOPYRIDINE HCL 200 MG PO TABS: 200.0000 mg | ORAL_TABLET | Freq: Three times a day (TID) | ORAL | 0 refills | Status: DC

## 2020-01-08 MED ORDER — CIPROFLOXACIN HCL 500 MG PO TABS: 500.0000 mg | ORAL_TABLET | Freq: Two times a day (BID) | ORAL | 0 refills | Status: DC

## 2020-01-08 NOTE — Telephone Encounter (Signed)
pharmacy out of cipro, sending levofloxacin 250 mg x 3 days as alternative

## 2020-01-08 NOTE — Progress Notes (Signed)
COVID Vaccine Completed:Yes Date COVID Vaccine completed:05/15/19 COVID vaccine manufacturer: Pfizer      PCP - Delila Spence PA Cardiologist - none  Chest x-ray - no EKG - no Stress Test - no ECHO -no Cardiac Cath - no Pacemaker/ICD device last checked:NA  Sleep Study - no CPAP -   Fasting Blood Sugar - NA Checks Blood Sugar _____ times a day  Blood Thinner Instructions:NA Aspirin Instructions: Last Dose:  Anesthesia review:   Patient denies shortness of breath, fever, cough and chest pain at PAT appointment yes  Patient verbalized understanding of instructions that were given to them at the PAT appointment. Patient was also instructed that they will need to review over the PAT instructions again at home before surgery.yes Pt is a smoker but denies SOB climbing stairs, doing housework or with ADLs

## 2020-01-08 NOTE — ED Provider Notes (Signed)
[name]    [name]: [id number] Arrival date & time: 01/08/20  0904      History   Chief Complaint Chief Complaint  Patient presents with  . Dysuria    HPI Vicki Pena is a 49 y.o. female presenting today for evaluation of dysuria and hematuria.  Patient reports that symptoms began yesterday.  Recently had UTI approximately 2 weeks ago, positive culture results.  Took Macrobid, patient feels she has become resistant to this.  Has had multiple UTIs since she was a teenager.  Feels she has responded best to Cipro in the past.  Has never seen urologist.  At times has had 3-4 UTIs per year.  HPI  Past Medical History:  Diagnosis Date  . Cervical kyphosis   . Chronic neck pain   . Complication of anesthesia   . Osteoarthritis, shoulder   . PONV (postoperative nausea and vomiting)     Patient Active Problem List   Diagnosis Date Noted  . Numbness and tingling in right hand 11/06/2019  . Chronic right shoulder pain 11/06/2019  . Osteoarthritis of right shoulder 08/25/2017  . Localized primary osteoarthritis of right shoulder region 07/26/2017    Past Surgical History:  Procedure Laterality Date  . ABDOMINAL HYSTERECTOMY    . APPENDECTOMY    . CESAREAN SECTION    . FOOT SURGERY    . TONSILLECTOMY    . TOTAL SHOULDER ARTHROPLASTY Right 08/25/2017   Procedure: RIGHT TOTAL SHOULDER ARTHROPLASTY;  Surgeon: Yolonda Kida, MD;  Location: [facility] OR;  Service: Orthopedics;  Laterality: Right;  . TUBAL LIGATION      OB History   No obstetric history on file.      Home Medications    Prior to Admission medications   Medication Sig Start Date End Date Taking? Authorizing Provider  Aspirin-Caffeine (BC FAST PAIN RELIEF PO) Take 1 packet by mouth daily as needed (pain).    [provider]  ciprofloxacin (CIPRO) 500 MG tablet Take 1 tablet (500 mg total) by mouth every 12 (twelve) hours for 7 days. 01/08/20 01/15/20  Charlis Harner C, PA-C  ibuprofen  (ADVIL) 800 MG tablet Take 800 mg by mouth every 8 (eight) hours as needed for moderate pain.     [provider]  Melatonin 10 MG CAPS Take 50 mg by mouth at bedtime as needed (sleep).    [provider]  phenazopyridine (PYRIDIUM) 200 MG tablet Take 1 tablet (200 mg total) by mouth 3 (three) times daily. 01/08/20   Segundo Makela C, PA-C  DULoxetine (CYMBALTA) 30 MG capsule Take 1 capsule (30 mg total) by mouth daily. Patient not taking: Reported on 12/31/2019 11/06/19 01/08/20  Levert Feinstein, MD    Family History Family History  Problem Relation Age of Onset  . Diabetes Mother   . COPD Mother   . Heart disease Father   . Arthritis Father   . Heart disease Brother   . Drug abuse Brother   . Breast cancer Maternal Aunt     Social History Social History   Tobacco Use  . Smoking status: Current Every Day Smoker    Packs/day: 1.00    Years: 15.00    Pack years: 15.00    Types: Cigarettes  . Smokeless tobacco: Never Used  Vaping Use  . Vaping Use: Never used  Substance Use Topics  . Alcohol use: Yes    Alcohol/week: 0.0 standard drinks    Comment: occasional  . Drug use: No  Allergies   Amoxicillin and Penicillins   Review of Systems Review of Systems  Constitutional: Negative for fever.  Respiratory: Negative for shortness of breath.   Cardiovascular: Negative for chest pain.  Gastrointestinal: Negative for abdominal pain, diarrhea, nausea and vomiting.  Genitourinary: Positive for dysuria and hematuria. Negative for flank pain, genital sores, menstrual problem, vaginal bleeding, vaginal discharge and vaginal pain.  Musculoskeletal: Negative for back pain.  Skin: Negative for rash.  Neurological: Negative for dizziness, light-headedness and headaches.     Physical Exam Triage Vital Signs ED Triage Vitals  Enc Vitals Group     BP 01/08/20 0940 127/87     Pulse Rate 01/08/20 0940 90     Resp 01/08/20 0940 16     Temp 01/08/20 0940 98 F (36.7  C)     Temp Source 01/08/20 0940 Oral     SpO2 01/08/20 0940 95 %     Weight --      Height --      Head Circumference --      Peak Flow --      Pain Score 01/08/20 0952 0     Pain Loc --      Pain Edu? --      Excl. in GC? --    No data found.  Updated Vital Signs BP 127/87 (BP Location: Left Arm)   Pulse 90   Temp 98 F (36.7 C) (Oral)   Resp 16   LMP 02/07/1998   SpO2 95%   Visual Acuity Right Eye Distance:   Left Eye Distance:   Bilateral Distance:    Right Eye Near:   Left Eye Near:    Bilateral Near:     Physical Exam Vitals and nursing note reviewed.  Constitutional:      Appearance: She is well-developed.     Comments: No acute distress  HENT:     Head: Normocephalic and atraumatic.     Nose: Nose normal.  Eyes:     Conjunctiva/sclera: Conjunctivae normal.  Cardiovascular:     Rate and Rhythm: Normal rate.  Pulmonary:     Effort: Pulmonary effort is normal. No respiratory distress.  Abdominal:     General: There is no distension.  Musculoskeletal:        General: Normal range of motion.     Cervical back: Neck supple.  Skin:    General: Skin is warm and dry.  Neurological:     Mental Status: She is alert and oriented to person, place, and time.      UC Treatments / Results  Labs (all labs ordered are listed, but only abnormal results are displayed) Labs Reviewed  POCT URINALYSIS DIP (MANUAL ENTRY) - Abnormal; Notable for the following components:      Result Value   Color, UA orange (*)    Glucose, UA =100 (*)    Bilirubin, UA small (*)    Ketones, POC UA trace (5) (*)    Blood, UA moderate (*)    Protein Ur, POC >=300 (*)    Urobilinogen, UA 4.0 (*)    Nitrite, UA Positive (*)    Leukocytes, UA Large (3+) (*)    All other components within normal limits  URINE CULTURE    EKG   Radiology No results found.  Procedures Procedures (including critical care time)  Medications Ordered in UC Medications - No data to  display  Initial Impression / Assessment and Plan / UC Course  I have reviewed the triage  vital signs and the nursing notes.  Pertinent labs & imaging results that were available during my care of the patient were reviewed by me and considered in my medical decision making (see chart for details).     UTI-consistent with UTI based off UA, but did take Azo prior to arrival.  Has positive cultures in the past, recently completed course with recurrence of symptoms will place on Cipro x1 week.  Pyridium for discomfort.  Discussed following up with urology if continued to be recurrent/resistant.  Discussed strict return precautions. Patient verbalized understanding and is agreeable with plan.  Final Clinical Impressions(s) / UC Diagnoses   Final diagnoses:  Lower urinary tract infection, acute     Discharge Instructions     Urine showed evidence of infection. We are treating you with cipro- twice daily for 1 week. Be sure to take full course. Stay hydrated- urine should be pale yellow to clear. My continue azo for relief of burning while infection is being cleared.   Please return or follow up with your primary provider if symptoms not improving with treatment. Please return sooner if you have worsening of symptoms or develop fever, nausea, vomiting, abdominal pain, back pain, lightheadedness, dizziness.     ED Prescriptions    Medication Sig Dispense Auth. Provider   phenazopyridine (PYRIDIUM) 200 MG tablet Take 1 tablet (200 mg total) by mouth 3 (three) times daily. 6 tablet Esli Clements C, PA-C   ciprofloxacin (CIPRO) 500 MG tablet Take 1 tablet (500 mg total) by mouth every 12 (twelve) hours for 7 days. 14 tablet Lynesha Bango, [place] C, PA-C     PDMP not reviewed this encounter.   Sherah Lund, [place] C, PA-C 01/08/20 1020

## 2020-01-08 NOTE — ED Triage Notes (Signed)
Dysuria and hematuria since yesterday. Recently treated for a UTI (2weeks ago).

## 2020-01-08 NOTE — Discharge Instructions (Signed)
Urine showed evidence of infection. We are treating you with cipro- twice daily for 1 week. Be sure to take full course. Stay hydrated- urine should be pale yellow to clear. My continue azo for relief of burning while infection is being cleared.   Please return or follow up with your primary provider if symptoms not improving with treatment. Please return sooner if you have worsening of symptoms or develop fever, nausea, vomiting, abdominal pain, back pain, lightheadedness, dizziness.

## 2020-01-08 NOTE — Telephone Encounter (Signed)
Resending meds from visit today

## 2020-01-10 LAB — URINE CULTURE: Culture: 70000 — AB

## 2020-01-14 ENCOUNTER — Other Ambulatory Visit (HOSPITAL_COMMUNITY)
Admission: RE | Admit: 2020-01-14 | Discharge: 2020-01-14 | Disposition: A | Discharge status: Home / Self Care | Payer: BC Managed Care – PPO | Source: Ambulatory Visit | Attending: Orthopedic Surgery | Admitting: Orthopedic Surgery

## 2020-01-14 DIAGNOSIS — Z7982 Long term (current) use of aspirin: Secondary | ICD-10-CM | POA: Diagnosis not present

## 2020-01-14 DIAGNOSIS — Z880 Allergy status to penicillin: Secondary | ICD-10-CM | POA: Diagnosis not present

## 2020-01-14 DIAGNOSIS — Z87892 Personal history of anaphylaxis: Secondary | ICD-10-CM | POA: Diagnosis not present

## 2020-01-14 DIAGNOSIS — Z20822 Contact with and (suspected) exposure to covid-19: Secondary | ICD-10-CM | POA: Insufficient documentation

## 2020-01-14 DIAGNOSIS — Z01812 Encounter for preprocedural laboratory examination: Secondary | ICD-10-CM | POA: Insufficient documentation

## 2020-01-14 DIAGNOSIS — Y792 Prosthetic and other implants, materials and accessory orthopedic devices associated with adverse incidents: Secondary | ICD-10-CM | POA: Diagnosis not present

## 2020-01-14 DIAGNOSIS — M65811 Other synovitis and tenosynovitis, right shoulder: Secondary | ICD-10-CM | POA: Diagnosis not present

## 2020-01-14 DIAGNOSIS — T84038A Mechanical loosening of other internal prosthetic joint, initial encounter: Secondary | ICD-10-CM | POA: Diagnosis not present

## 2020-01-14 DIAGNOSIS — F17210 Nicotine dependence, cigarettes, uncomplicated: Secondary | ICD-10-CM | POA: Diagnosis not present

## 2020-01-14 DIAGNOSIS — Z96611 Presence of right artificial shoulder joint: Secondary | ICD-10-CM | POA: Diagnosis not present

## 2020-01-14 LAB — SARS CORONAVIRUS 2 (TAT 6-24 HRS): SARS Coronavirus 2: NEGATIVE

## 2020-01-17 ENCOUNTER — Ambulatory Visit (HOSPITAL_COMMUNITY)
Admission: RE | Admit: 2020-01-17 | Discharge: 2020-01-17 | Disposition: A | Discharge status: Home / Self Care | Payer: BC Managed Care – PPO | Attending: Orthopedic Surgery | Admitting: Orthopedic Surgery

## 2020-01-17 ENCOUNTER — Encounter (HOSPITAL_COMMUNITY)
Admission: RE | Disposition: A | Discharge status: Home / Self Care | Payer: Self-pay | Source: Home / Self Care | Attending: Orthopedic Surgery

## 2020-01-17 ENCOUNTER — Ambulatory Visit (HOSPITAL_COMMUNITY): Payer: BC Managed Care – PPO

## 2020-01-17 ENCOUNTER — Encounter (HOSPITAL_COMMUNITY): Payer: Self-pay | Admitting: Orthopedic Surgery

## 2020-01-17 ENCOUNTER — Ambulatory Visit (HOSPITAL_COMMUNITY): Payer: BC Managed Care – PPO | Admitting: Certified Registered Nurse Anesthetist

## 2020-01-17 DIAGNOSIS — T84038A Mechanical loosening of other internal prosthetic joint, initial encounter: Secondary | ICD-10-CM | POA: Diagnosis not present

## 2020-01-17 DIAGNOSIS — Z96611 Presence of right artificial shoulder joint: Secondary | ICD-10-CM | POA: Insufficient documentation

## 2020-01-17 DIAGNOSIS — Z20822 Contact with and (suspected) exposure to covid-19: Secondary | ICD-10-CM | POA: Diagnosis not present

## 2020-01-17 DIAGNOSIS — Z87892 Personal history of anaphylaxis: Secondary | ICD-10-CM | POA: Insufficient documentation

## 2020-01-17 DIAGNOSIS — Z880 Allergy status to penicillin: Secondary | ICD-10-CM | POA: Insufficient documentation

## 2020-01-17 DIAGNOSIS — Z471 Aftercare following joint replacement surgery: Secondary | ICD-10-CM | POA: Diagnosis not present

## 2020-01-17 DIAGNOSIS — M65811 Other synovitis and tenosynovitis, right shoulder: Secondary | ICD-10-CM | POA: Diagnosis not present

## 2020-01-17 DIAGNOSIS — Y792 Prosthetic and other implants, materials and accessory orthopedic devices associated with adverse incidents: Secondary | ICD-10-CM | POA: Diagnosis not present

## 2020-01-17 DIAGNOSIS — F17210 Nicotine dependence, cigarettes, uncomplicated: Secondary | ICD-10-CM | POA: Insufficient documentation

## 2020-01-17 DIAGNOSIS — Z7982 Long term (current) use of aspirin: Secondary | ICD-10-CM | POA: Diagnosis not present

## 2020-01-17 DIAGNOSIS — G8918 Other acute postprocedural pain: Secondary | ICD-10-CM | POA: Diagnosis not present

## 2020-01-17 DIAGNOSIS — T8484XA Pain due to internal orthopedic prosthetic devices, implants and grafts, initial encounter: Secondary | ICD-10-CM | POA: Diagnosis not present

## 2020-01-17 HISTORY — PX: TOTAL SHOULDER REVISION: SHX6130

## 2020-01-17 LAB — TYPE AND SCREEN
ABO/RH(D): O POS
Antibody Screen: NEGATIVE

## 2020-01-17 LAB — ABO/RH: ABO/RH(D): O POS

## 2020-01-17 SURGERY — REVISION, TOTAL ARTHROPLASTY, SHOULDER
Anesthesia: General | Site: Shoulder | Laterality: Right

## 2020-01-17 MED ORDER — DEXAMETHASONE SODIUM PHOSPHATE 10 MG/ML IJ SOLN
INTRAMUSCULAR | Status: AC
  Filled 2020-01-17: qty 1

## 2020-01-17 MED ORDER — PHENYLEPHRINE 40 MCG/ML (10ML) SYRINGE FOR IV PUSH (FOR BLOOD PRESSURE SUPPORT)
PREFILLED_SYRINGE | INTRAVENOUS | Status: DC | PRN
  Administered 2020-01-17 (×4): 80 ug via INTRAVENOUS

## 2020-01-17 MED ORDER — ACETAMINOPHEN 10 MG/ML IV SOLN
1000.0000 mg | Freq: Once | INTRAVENOUS | Status: DC | PRN
  Administered 2020-01-17: 1000 mg via INTRAVENOUS

## 2020-01-17 MED ORDER — HYDROCODONE-ACETAMINOPHEN 7.5-325 MG PO TABS: 1.0000 | ORAL_TABLET | Freq: Once | ORAL | Status: DC | PRN

## 2020-01-17 MED ORDER — ONDANSETRON HCL 4 MG/2ML IJ SOLN
INTRAMUSCULAR | Status: AC
  Filled 2020-01-17: qty 2

## 2020-01-17 MED ORDER — ACETAMINOPHEN 10 MG/ML IV SOLN
INTRAVENOUS | Status: AC
  Filled 2020-01-17: qty 100

## 2020-01-17 MED ORDER — OXYCODONE HCL 5 MG PO TABS: 5.0000 mg | ORAL_TABLET | Freq: Three times a day (TID) | ORAL | 0 refills | Status: AC | PRN

## 2020-01-17 MED ORDER — PROMETHAZINE HCL 25 MG/ML IJ SOLN
INTRAMUSCULAR | Status: AC
  Filled 2020-01-17: qty 1

## 2020-01-17 MED ORDER — ORAL CARE MOUTH RINSE
15.0000 mL | Freq: Once | OROMUCOSAL | Status: AC
  Administered 2020-01-17: 15 mL via OROMUCOSAL

## 2020-01-17 MED ORDER — MIDAZOLAM HCL 2 MG/2ML IJ SOLN
1.0000 mg | Freq: Once | INTRAMUSCULAR | Status: AC
  Administered 2020-01-17: 2 mg via INTRAVENOUS
  Filled 2020-01-17: qty 2

## 2020-01-17 MED ORDER — FENTANYL CITRATE (PF) 100 MCG/2ML IJ SOLN
INTRAMUSCULAR | Status: AC
  Filled 2020-01-17: qty 2

## 2020-01-17 MED ORDER — PROMETHAZINE HCL 25 MG/ML IJ SOLN: 6.2500 mg | INTRAMUSCULAR | Status: DC | PRN

## 2020-01-17 MED ORDER — DEXMEDETOMIDINE (PRECEDEX) IN NS 20 MCG/5ML (4 MCG/ML) IV SYRINGE
PREFILLED_SYRINGE | INTRAVENOUS | Status: AC
  Filled 2020-01-17: qty 5

## 2020-01-17 MED ORDER — BUPIVACAINE HCL (PF) 0.5 % IJ SOLN
INTRAMUSCULAR | Status: DC | PRN
  Administered 2020-01-17: 15 mL via PERINEURAL

## 2020-01-17 MED ORDER — TRANEXAMIC ACID-NACL 1000-0.7 MG/100ML-% IV SOLN
1000.0000 mg | INTRAVENOUS | Status: AC
  Administered 2020-01-17: 1000 mg via INTRAVENOUS
  Filled 2020-01-17: qty 100

## 2020-01-17 MED ORDER — BUPIVACAINE LIPOSOME 1.3 % IJ SUSP
INTRAMUSCULAR | Status: DC | PRN
  Administered 2020-01-17: 10 mL via PERINEURAL

## 2020-01-17 MED ORDER — FENTANYL CITRATE (PF) 100 MCG/2ML IJ SOLN
50.0000 ug | Freq: Once | INTRAMUSCULAR | Status: AC
  Administered 2020-01-17: 50 ug via INTRAVENOUS
  Filled 2020-01-17: qty 2

## 2020-01-17 MED ORDER — ONDANSETRON 4 MG PO TBDP: 4.0000 mg | ORAL_TABLET | Freq: Three times a day (TID) | ORAL | 0 refills | Status: DC | PRN

## 2020-01-17 MED ORDER — PROPOFOL 10 MG/ML IV BOLUS
INTRAVENOUS | Status: AC
  Filled 2020-01-17: qty 20

## 2020-01-17 MED ORDER — VANCOMYCIN HCL 1000 MG IV SOLR
INTRAVENOUS | Status: AC
  Filled 2020-01-17: qty 1000

## 2020-01-17 MED ORDER — PROPOFOL 10 MG/ML IV BOLUS
INTRAVENOUS | Status: DC | PRN
  Administered 2020-01-17: 170 mg via INTRAVENOUS

## 2020-01-17 MED ORDER — VANCOMYCIN HCL 1 G IV SOLR
INTRAVENOUS | Status: DC | PRN
  Administered 2020-01-17 (×2): 1000 mg via TOPICAL

## 2020-01-17 MED ORDER — LACTATED RINGERS IV SOLN: INTRAVENOUS | Status: DC

## 2020-01-17 MED ORDER — STERILE WATER FOR IRRIGATION IR SOLN
Status: DC | PRN
  Administered 2020-01-17: 2000 mL

## 2020-01-17 MED ORDER — 0.9 % SODIUM CHLORIDE (POUR BTL) OPTIME
TOPICAL | Status: DC | PRN
  Administered 2020-01-17 (×2): 1000 mL

## 2020-01-17 MED ORDER — CLINDAMYCIN PHOSPHATE 900 MG/50ML IV SOLN
900.0000 mg | INTRAVENOUS | Status: AC
  Administered 2020-01-17: 900 mg via INTRAVENOUS
  Filled 2020-01-17: qty 50

## 2020-01-17 MED ORDER — DEXMEDETOMIDINE HCL 200 MCG/2ML IV SOLN
INTRAVENOUS | Status: DC | PRN
  Administered 2020-01-17: 8 ug via INTRAVENOUS

## 2020-01-17 MED ORDER — ONDANSETRON HCL 4 MG/2ML IJ SOLN
INTRAMUSCULAR | Status: DC | PRN
  Administered 2020-01-17: 4 mg via INTRAVENOUS

## 2020-01-17 MED ORDER — PHENYLEPHRINE 40 MCG/ML (10ML) SYRINGE FOR IV PUSH (FOR BLOOD PRESSURE SUPPORT)
PREFILLED_SYRINGE | INTRAVENOUS | Status: AC
  Filled 2020-01-17: qty 10

## 2020-01-17 MED ORDER — CHLORHEXIDINE GLUCONATE 0.12 % MT SOLN: 15.0000 mL | Freq: Once | OROMUCOSAL | Status: AC

## 2020-01-17 MED ORDER — CLINDAMYCIN HCL 150 MG PO CAPS: 300.0000 mg | ORAL_CAPSULE | Freq: Three times a day (TID) | ORAL | 0 refills | Status: AC

## 2020-01-17 MED ORDER — SCOPOLAMINE 1 MG/3DAYS TD PT72
1.0000 | MEDICATED_PATCH | Freq: Once | TRANSDERMAL | Status: AC
  Administered 2020-01-17: 1 via TRANSDERMAL
  Filled 2020-01-17: qty 1

## 2020-01-17 MED ORDER — HYDROMORPHONE HCL 1 MG/ML IJ SOLN
INTRAMUSCULAR | Status: AC
  Filled 2020-01-17: qty 1

## 2020-01-17 MED ORDER — LIDOCAINE 2% (20 MG/ML) 5 ML SYRINGE
INTRAMUSCULAR | Status: DC | PRN
  Administered 2020-01-17: 60 mg via INTRAVENOUS

## 2020-01-17 MED ORDER — LIDOCAINE HCL (PF) 2 % IJ SOLN
INTRAMUSCULAR | Status: AC
  Filled 2020-01-17: qty 5

## 2020-01-17 MED ORDER — MEPERIDINE HCL 50 MG/ML IJ SOLN: 6.2500 mg | INTRAMUSCULAR | Status: DC | PRN

## 2020-01-17 MED ORDER — FENTANYL CITRATE (PF) 100 MCG/2ML IJ SOLN
INTRAMUSCULAR | Status: DC | PRN
  Administered 2020-01-17 (×3): 50 ug via INTRAVENOUS
  Administered 2020-01-17: 100 ug via INTRAVENOUS

## 2020-01-17 MED ORDER — HYDROMORPHONE HCL 1 MG/ML IJ SOLN
0.2500 mg | INTRAMUSCULAR | Status: DC | PRN
  Administered 2020-01-17 (×2): 0.5 mg via INTRAVENOUS

## 2020-01-17 MED ORDER — ROCURONIUM BROMIDE 10 MG/ML (PF) SYRINGE
PREFILLED_SYRINGE | INTRAVENOUS | Status: DC | PRN
  Administered 2020-01-17: 70 mg via INTRAVENOUS
  Administered 2020-01-17: 30 mg via INTRAVENOUS

## 2020-01-17 MED ORDER — ROCURONIUM BROMIDE 10 MG/ML (PF) SYRINGE
PREFILLED_SYRINGE | INTRAVENOUS | Status: AC
  Filled 2020-01-17: qty 10

## 2020-01-17 MED ORDER — DEXAMETHASONE SODIUM PHOSPHATE 4 MG/ML IJ SOLN
INTRAMUSCULAR | Status: DC | PRN
  Administered 2020-01-17: 10 mg via INTRAVENOUS

## 2020-01-17 MED ORDER — EPHEDRINE SULFATE-NACL 50-0.9 MG/10ML-% IV SOSY
PREFILLED_SYRINGE | INTRAVENOUS | Status: DC | PRN
  Administered 2020-01-17: 5 mg via INTRAVENOUS
  Administered 2020-01-17: 10 mg via INTRAVENOUS

## 2020-01-17 SURGICAL SUPPLY — 67 items
AID PSTN UNV HD RSTRNT DISP (MISCELLANEOUS) ×1
BIT DRILL 5/64X5 DISP (BIT) ×3 IMPLANT
BIT DRILL FLUTED 3.0 STRL (BIT) ×3 IMPLANT
BLADE SAG 18X100X1.27 (BLADE) ×6 IMPLANT
BSPLAT GLND +2X24 MDLR (Joint) ×1 IMPLANT
CLOSURE WOUND 1/2 X4 (GAUZE/BANDAGES/DRESSINGS) ×1
COVER SURGICAL LIGHT HANDLE (MISCELLANEOUS) ×3 IMPLANT
COVER WAND RF STERILE (DRAPES) ×3 IMPLANT
CUP SUT UNIV REVERS 36 NEUTRAL (Cup) ×3 IMPLANT
DRAPE IMP U-DRAPE 54X76 (DRAPES) ×6 IMPLANT
DRAPE INCISE IOBAN 66X45 STRL (DRAPES) ×3 IMPLANT
DRAPE ORTHO SPLIT 77X108 STRL (DRAPES) ×6
DRAPE SURG 17X23 STRL (DRAPES) ×3 IMPLANT
DRAPE SURG ORHT 6 SPLT 77X108 (DRAPES) ×2 IMPLANT
DRAPE U-SHAPE 47X51 STRL (DRAPES) ×3 IMPLANT
DRESSING AQUACEL AG SP 3.5X6 (GAUZE/BANDAGES/DRESSINGS) ×1 IMPLANT
DRSG AQUACEL AG ADV 3.5X10 (GAUZE/BANDAGES/DRESSINGS) ×3 IMPLANT
DRSG AQUACEL AG SP 3.5X6 (GAUZE/BANDAGES/DRESSINGS) ×3
DURAPREP 26ML APPLICATOR (WOUND CARE) ×3 IMPLANT
ELECT BLADE TIP CTD 4 INCH (ELECTRODE) ×3 IMPLANT
ELECT REM PT RETURN 15FT ADLT (MISCELLANEOUS) ×3 IMPLANT
GLENOID UNI REV MOD 24 +2 LAT (Joint) ×3 IMPLANT
GLENOSPHERE 36 +4 LAT/24 (Joint) ×3 IMPLANT
GLOVE BIO SURGEON STRL SZ7.5 (GLOVE) ×6 IMPLANT
GLOVE BIOGEL PI IND STRL 8 (GLOVE) ×2 IMPLANT
GLOVE BIOGEL PI INDICATOR 8 (GLOVE) ×4
GOWN STRL REUS W/ TWL LRG LVL3 (GOWN DISPOSABLE) ×1 IMPLANT
GOWN STRL REUS W/ TWL XL LVL3 (GOWN DISPOSABLE) ×2 IMPLANT
GOWN STRL REUS W/TWL LRG LVL3 (GOWN DISPOSABLE) ×3
GOWN STRL REUS W/TWL XL LVL3 (GOWN DISPOSABLE) ×6
INSERT HUMERAL 36 +6 (Shoulder) ×3 IMPLANT
KIT BASIN OR (CUSTOM PROCEDURE TRAY) ×3 IMPLANT
KIT TURNOVER KIT A (KITS) ×3 IMPLANT
MANIFOLD NEPTUNE II (INSTRUMENTS) ×3 IMPLANT
NEEDLE HYPO 25X1 1.5 SAFETY (NEEDLE) ×3 IMPLANT
NEEDLE MAYO CATGUT SZ4 (NEEDLE) ×3 IMPLANT
NS IRRIG 1000ML POUR BTL (IV SOLUTION) ×3 IMPLANT
OSTEOTOME THIN 6.0 3 (INSTRUMENTS) ×3 IMPLANT
PACK SHOULDER (CUSTOM PROCEDURE TRAY) ×3 IMPLANT
PAD ARMBOARD 7.5X6 YLW CONV (MISCELLANEOUS) ×6 IMPLANT
PIN SET MODULAR GLENOID SYSTEM (PIN) ×3 IMPLANT
RESTRAINT HEAD UNIVERSAL NS (MISCELLANEOUS) ×3 IMPLANT
SCREW CENTRAL MOD 30MM (Screw) ×3 IMPLANT
SCREW PERI LOCK 5.5X16 (Screw) ×6 IMPLANT
SCREW PERI LOCK 5.5X32 (Screw) ×3 IMPLANT
SCREW PERIPHERAL 5.5X28 LOCK (Screw) ×3 IMPLANT
SLING ARM FOAM STRAP MED (SOFTGOODS) ×3 IMPLANT
SLING ARM IMMOBILIZER LRG (SOFTGOODS) IMPLANT
SLING ARM IMMOBILIZER MED (SOFTGOODS) IMPLANT
SPONGE LAP 18X18 RF (DISPOSABLE) IMPLANT
SPONGE LAP 4X18 RFD (DISPOSABLE) ×3 IMPLANT
STEM HUMERAL UNIVER REV SIZE 7 (Stem) ×3 IMPLANT
STRIP CLOSURE SKIN 1/2X4 (GAUZE/BANDAGES/DRESSINGS) ×2 IMPLANT
SUCTION FRAZIER HANDLE 10FR (MISCELLANEOUS) ×3
SUCTION TUBE FRAZIER 10FR DISP (MISCELLANEOUS) ×1 IMPLANT
SUT FIBERWIRE #2 38 T-5 BLUE (SUTURE) ×3
SUT MNCRL AB 3-0 PS2 18 (SUTURE) ×3 IMPLANT
SUT VIC AB 1 CT1 27 (SUTURE) ×3
SUT VIC AB 1 CT1 27XBRD ANBCTR (SUTURE) ×1 IMPLANT
SUT VIC AB 2-0 CT1 27 (SUTURE) ×6
SUT VIC AB 2-0 CT1 TAPERPNT 27 (SUTURE) ×2 IMPLANT
SUTURE FIBERWR #2 38 T-5 BLUE (SUTURE) ×1 IMPLANT
SYR CONTROL 10ML LL (SYRINGE) ×3 IMPLANT
TOWEL OR 17X26 10 PK STRL BLUE (TOWEL DISPOSABLE) ×3 IMPLANT
TOWER CARTRIDGE SMART MIX (DISPOSABLE) IMPLANT
WATER STERILE IRR 1000ML POUR (IV SOLUTION) ×3 IMPLANT
YANKAUER SUCT BULB TIP NO VENT (SUCTIONS) ×3 IMPLANT

## 2020-01-17 NOTE — Transfer of Care (Signed)
Immediate Anesthesia Transfer of Care Note  Patient: Vicki Pena  Procedure(s) Performed: TOTAL SHOULDER REVISION ALL COMPONENTS (Right Shoulder)  Patient Location: PACU  Anesthesia Type:GA combined with regional for post-op pain  Level of Consciousness: awake, alert , oriented and patient cooperative  Airway & Oxygen Therapy: Patient Spontanous Breathing and Patient connected to face mask  Post-op Assessment: Report given to RN and Post -op Vital signs reviewed and stable  Post vital signs: Reviewed and stable  Last Vitals:  Vitals Value Taken Time  BP 124/82 01/17/20 1623  Temp    Pulse 84 01/17/20 1625  Resp 16 01/17/20 1625  SpO2 98 % 01/17/20 1625  Vitals shown include unvalidated device data.  Last Pain:  Vitals:   01/17/20 1155  TempSrc:   PainSc: 0-No pain         Complications: No complications documented.

## 2020-01-17 NOTE — Anesthesia Procedure Notes (Signed)
Procedure Name: Intubation Date/Time: 01/17/2020 1:35 PM Performed by: Vanessa Sabina, CRNA Pre-anesthesia Checklist: Patient identified, Emergency Drugs available, Suction available and Patient being monitored Patient Re-evaluated:Patient Re-evaluated prior to induction Oxygen Delivery Method: Circle system utilized Preoxygenation: Pre-oxygenation with 100% oxygen Induction Type: IV induction Ventilation: Mask ventilation without difficulty Laryngoscope Size: 2 and Miller Grade View: Grade II Tube type: Oral Tube size: 7.0 mm Number of attempts: 1 Airway Equipment and Method: Stylet Placement Confirmation: ETT inserted through vocal cords under direct vision,  positive ETCO2 and breath sounds checked- equal and bilateral Secured at: 21 cm Tube secured with: Tape Dental Injury: Teeth and Oropharynx as per pre-operative assessment  Difficulty Due To: Difficulty was anticipated, Difficult Airway- due to reduced neck mobility and Difficult Airway- due to anterior larynx

## 2020-01-17 NOTE — Anesthesia Preprocedure Evaluation (Addendum)
Anesthesia Evaluation  Patient identified by MRN, date of birth, ID band Patient awake    Reviewed: Allergy & Precautions, NPO status , Patient's Chart, lab work & pertinent test results  History of Anesthesia Complications (+) PONV and history of anesthetic complications  Airway Mallampati: II  TM Distance: >3 FB Neck ROM: Full    Dental no notable dental hx. (+) Teeth Intact, Dental Advisory Given   Pulmonary Current SmokerPatient did not abstain from smoking.,    Pulmonary exam normal breath sounds clear to auscultation       Cardiovascular Exercise Tolerance: Good Normal cardiovascular exam Rhythm:Regular Rate:Normal     Neuro/Psych Chronic neck pain Chronic R hand tingling first 3 fingers  Neuromuscular disease    GI/Hepatic Neg liver ROS,   Endo/Other  negative endocrine ROS  Renal/GU negative Renal ROS     Musculoskeletal  (+) Arthritis ,   Abdominal   Peds  Hematology Hgb 15.8   Anesthesia Other Findings All PCn [facility]  Reproductive/Obstetrics                           Anesthesia Physical Anesthesia Plan  ASA: II  Anesthesia Plan: General   Post-op Pain Management:  Regional for Post-op pain   Induction: Intravenous  PONV Risk Score and Plan: 4 or greater and Treatment may vary due to age or medical condition, Ondansetron, Dexamethasone and Scopolamine patch - Pre-op  Airway Management Planned: Oral ETT  Additional Equipment:   Intra-op Plan:   Post-operative Plan: Extubation in OR  Informed Consent: I have reviewed the patients History and Physical, chart, labs and discussed the procedure including the risks, benefits and alternatives for the proposed anesthesia with the patient or authorized representative who has indicated his/her understanding and acceptance.     Dental advisory given  Plan Discussed with:   Anesthesia Plan Comments: (GA w R ISB block)        Anesthesia Quick Evaluation

## 2020-01-17 NOTE — Op Note (Addendum)
01/17/2020  3:49 PM  PATIENT:  Vicki Pena    PRE-OPERATIVE DIAGNOSIS:  1. Right total shoulder disfunction, painful total shoulder   POST-OPERATIVE DIAGNOSIS:  1.  Right total shoulder glenoid polyethylene loosening  PROCEDURE:  TOTAL SHOULDER REVISION ALL COMPONENTS  SURGEON:  Yolonda Kida, MD  ASSISTANT: Dion Saucier, PA-C  Assistant attestation: PA Mcclung was utilized throughout the procedure for positioning the patient, approach to the shoulder, explantation of previous implants, and implantation of new implants.  ANESTHESIA:   General  ESTIMATED BLOOD LOSS: 100 cc  PREOPERATIVE INDICATIONS:  Vicki Pena is a  49 y.o. female with a diagnosis of Right total shoulder disfunction who failed conservative measures and elected for surgical management.  She had completed an exhaustive preoperative work-up including aspiration and culture, ultrasound, CT scan, and all of these were fairly benign in nature with no obvious loosening and no obvious rotator cuff failure.  The risks benefits and alternatives were discussed with the patient preoperatively including but not limited to the risks of infection, bleeding, nerve injury, cardiopulmonary complications, the need for revision surgery, dislocation, brachial plexus palsy, incomplete relief of pain, among others, and the patient was willing to proceed.  OPERATIVE IMPLANTS: Arthrex reverse arthroplasty system.  Size 7 humeral stem, 24 mm baseplate +2 mm of lateralization.  36+4 mm of lateralization glenosphere.  Standard humeral tray with a +6 polyethylene liner.  Operative explants:  Polyethylene glenoid en bloc with no gross failure.  Humeral head en bloc, humeral stem en bloc with no obvious complicating features.    OPERATIVE FINDINGS:  On the initial approach the shoulder we did encounter abundant adhesions in the subdeltoid and subpectoralis recesses.  Furthermore, there was no obvious rotator cuff tear of the  subscapularis or supraspinatus or infraspinatus or teres minor.  After entering the joint we did encounter a grossly loose polyethylene on the glenoid side.  This was noted superiorly with the ability to rock the polyethylene.  There was no eccentric wear pattern of the polyethylene.  The humeral stem was not grossly loose.   OPERATIVE PROCEDURE: The patient was brought to the operating room and placed in the supine position. General anesthesia was administered. IV antibiotics were given. A Foley was placed. Time out was performed. The upper extremity was prepped and draped in usual sterile fashion. The patient was in a beachchair position. Deltopectoral approach was carried out.  We did encounter abundant adhesions in the subpectoralis recess as well as the subdeltoid space.  These were carefully released bluntly with Cobb elevator and finger dissection.  We did also release the upper border of the pectoralis major tendon from the humerus.  At this time deep retractors were placed in the subacromial and subdeltoid space.  We did inspect the rotator cuff and it was found to be intact x4 tendons.   The subscapularis was released off of the bone.  This was also tagged for later repair.   Next we disimpacted the humeral head from the [facility] taper on the stem.  We also took the collar and stem off of the humeral side.  This allowed [place] to place deep retractors to approach the glenoid.  We then placed our deep retractors around the glenoid.  At this time we inspected the polyethylene.  There was no eccentric wear pattern and no obvious signs of infection in the deep space or superficial space.  We did note that with manual manipulation of the glenoid polyethylene, there was some gross loosening  noted superiorly.  We then utilized a straight osteotome which was placed across the backside of the polyethylene.  We were able to then lever the polyethylene out of the bone.  We noted that this was removed en bloc  with no fracture of the polyethylene pegs.  And no obvious signs of gross failure.  We next turned our attention to performing circumferential releases of some redundant synovitis and inflammatory tissue of the joint.  This was accomplished with Bovie electrocautery and a deep 15 blade.  Next, we began placing the new glenosphere baseplate.  We established a center position with the glenoid guide with 10 degrees of inferior declination.  We placed this in a center center position and bicortical.  We then drilled and prepared for the glenoid baseplate.  We drilled to a depth of 30 mm for the central screw.  We then placed the glenoid baseplate and applied a 30 mm 6.5 diameter compression screw centrally.  We then placed four peripheral locking screws.  We then impacted a 36 mm +4 lateralized glenosphere.  This had excellent coverage.  Attention was then turned back to the humerus.  The flexible osteotomes were utilized to resect bony on growth from the proximal stem.  This was done in a 360 degree fashion.  We then applied a distraction device and were able to back slap the stem out of the humeral canal.  There was minimal bone loss noted along the metaphysis or distal aspect.  Just proximal however, there was moderate bone loss where the subscapularis sutures had been.  We did also note a small avulsion fracture of the greater tuberosity while removing the stem.  We secured this with suture tape sutures for later repair to the final stem.  We then began sizing for our final implant.  We utilized broaches and found that a size 7 stem was appropriate.  We placed this in 30 degrees retroversion.  Final stem was then impacted into place.  This was placed in 135 degree position.  We then trialed polyethylene liners and found that a +6 mm liner was appropriate and adequate.  This was then placed into the final stem which had already been impacted into place.  We then used the previously placed suture tape sutures  to repair the greater tuberosity back to the stem.  Likewise, we did reapproximate the subscapularis tendon back to the anterior stem and humerus with the suture cup holes.  The arm was taken through range of motion with forward elevation external rotation internal rotation was no gross instability, and no undue tension on the conjoined tendon or deltoid musculature.  Throughout the procedure, we did apply vancomycin powder to the back of the central screw, baseplate, and into the humeral canal prior to implantation.  Likewise, we obtained and sent to the microbiology lab intraoperative cultures from the humeral side.  Again, there was no obvious sign of any deep space or superficial space infection.  Finally, we did place 1 g of vancomycin powder into the deltopectoral interval prior to closure of the deep space.  I then irrigated the shoulder copiously once more, repaired the deltopectoral interval with # 1 Vicryl followed by subcutaneous Vicryl, then monocryl for the skin,  with Steri-Strips and sterile gauze for the skin. The patient was awakened and returned back in stable and satisfactory condition. There no complications and they tolerated the procedure well.  All counts were correct x2.  Disposition: Ms. Jasek will be nonweightbearing for the first 2  weeks in her sling.  She may remove the sling for active range of motion of the shoulder and activities of daily living.  We will see her back in the office in 2 weeks for routine postoperative care as well as 2 radiographic views of the right shoulder, AP and scapular Y.

## 2020-01-17 NOTE — Brief Op Note (Signed)
01/17/2020  3:48 PM  PATIENT:  Vicki Pena  49 y.o. female  PRE-OPERATIVE DIAGNOSIS:  Right total shoulder disfunction  POST-OPERATIVE DIAGNOSIS:  Right total shoulder disfunction  PROCEDURE:  Procedure(s) with comments: TOTAL SHOULDER REVISION ALL COMPONENTS (Right) - 3 hrs Dr. Ranell Patrick to pop in to assist  SURGEON:  Surgeon(s) and Role:    * Aundria Rud, Noah Delaine, MD - Primary  PHYSICIAN ASSISTANT: Dion Saucier, PA-C   ANESTHESIA:   regional and general  EBL:  100 mL   BLOOD ADMINISTERED:none  DRAINS: none   LOCAL MEDICATIONS USED:  NONE  SPECIMEN:  No Specimen  DISPOSITION OF SPECIMEN:  N/A  COUNTS:  YES  TOURNIQUET:  * No tourniquets in log *  DICTATION: .Note written in EPIC  PLAN OF CARE: Discharge to home after PACU  PATIENT DISPOSITION:  PACU - hemodynamically stable.   Delay start of Pharmacological VTE agent (>24hrs) due to surgical blood loss or risk of bleeding: not applicable

## 2020-01-17 NOTE — Anesthesia Procedure Notes (Signed)
Anesthesia Regional Block: Interscalene brachial plexus block   Pre-Anesthetic Checklist: ,, timeout performed, Correct Patient, Correct Site, Correct Laterality, Correct Procedure, Correct Position, site marked, Risks and benefits discussed,  Surgical consent,  Pre-op evaluation,  At surgeon's request and post-op pain management  Laterality: Right and Upper  Prep: Maximum Sterile Barrier Precautions used, chloraprep       Needles:  Injection technique: Single-shot  Needle Type: Echogenic Needle     Needle Length: 5cm  Needle Gauge: 21     Additional Needles:   Procedures:,,,, ultrasound used (permanent image in chart),,,,  Narrative:  Start time: 01/17/2020 11:47 AM End time: 01/17/2020 11:56 AM Injection made incrementally with aspirations every 5 mL.  Performed by: Personally  Anesthesiologist: Trevor Iha, MD  Additional Notes: Block assessed prior to procedure. Patient tolerated procedure well.

## 2020-01-17 NOTE — Anesthesia Postprocedure Evaluation (Signed)
Anesthesia Post Note  Patient: Vicki Pena  Procedure(s) Performed: TOTAL SHOULDER REVISION ALL COMPONENTS (Right Shoulder)     Patient location during evaluation: PACU Anesthesia Type: General and Regional Level of consciousness: awake and alert Pain management: pain level controlled Vital Signs Assessment: post-procedure vital signs reviewed and stable Respiratory status: spontaneous breathing, nonlabored ventilation and respiratory function stable Cardiovascular status: blood pressure returned to baseline and stable Postop Assessment: no apparent nausea or vomiting Anesthetic complications: no   No complications documented.  Last Vitals:  Vitals:   01/17/20 1715 01/17/20 1730  BP: 105/64 103/78  Pulse: 74 83  Resp: 13 18  Temp:    SpO2: 96% 95%    Last Pain:  Vitals:   01/17/20 1730  TempSrc:   PainSc: 4                  Santiaga Butzin,W. EDMOND

## 2020-01-17 NOTE — Progress Notes (Signed)
AssistedDr. Houser with right, ultrasound guided, interscalene  block. Side rails up, monitors on throughout procedure. See vital signs in flow sheet. Tolerated Procedure well.  

## 2020-01-17 NOTE — H&P (Signed)
ORTHOPAEDIC H and p  REQUESTING PHYSICIAN: Yolonda Kida, MD  PCP:  Wilfrid Lund, [place]  Chief Complaint: Right shoulder rotator cuff failure  HPI: Vicki Pena is a 49 y.o. female who complains of right shoulder pain and dysfunction.  She is about 3-1/2 years out from an anatomic shoulder replacement.  She has had progressive pain and weakness.  She is here today for revision of her arthroplasty to a reverse shoulder arthroplasty.  Clinically she has signs and symptoms consistent with rotator cuff failure.  We discussed that treatment recommendation at length.  She is here today for surgery.  No new complaints.  Past Medical History:  Diagnosis Date  . Cervical kyphosis   . Chronic neck pain   . Complication of anesthesia   . Osteoarthritis, shoulder   . PONV (postoperative nausea and vomiting)    Past Surgical History:  Procedure Laterality Date  . ABDOMINAL HYSTERECTOMY    . APPENDECTOMY    . CESAREAN SECTION    . FOOT SURGERY    . TONSILLECTOMY    . TOTAL SHOULDER ARTHROPLASTY Right 08/25/2017   Procedure: RIGHT TOTAL SHOULDER ARTHROPLASTY;  Surgeon: Yolonda Kida, MD;  Location: [facility];  Service: Orthopedics;  Laterality: Right;  . TUBAL LIGATION     Social History   Socioeconomic History  . Marital status: Married    Spouse name: Not on file  . Number of children: 3  . Years of education: college  . Highest education level: Not on file  Occupational History  . Occupation: hairdresser  Tobacco Use  . Smoking status: Current Every Day Smoker    Packs/day: 1.00    Years: 15.00    Pack years: 15.00    Types: Cigarettes  . Smokeless tobacco: Never Used  Vaping Use  . Vaping Use: Never used  Substance and Sexual Activity  . Alcohol use: Yes    Alcohol/week: 0.0 standard drinks    Comment: occasional  . Drug use: No  . Sexual activity: Not on file  Other Topics Concern  . Not on file  Social History Narrative   Right-handed.   Lives at home  with family.   2-3 cups caffeine per day.   Social Determinants of Health   Financial Resource Strain: Not on file  Food Insecurity: Not on file  Transportation Needs: Not on file  Physical Activity: Not on file  Stress: Not on file  Social Connections: Not on file   Family History  Problem Relation Age of Onset  . Diabetes Mother   . COPD Mother   . Heart disease Father   . Arthritis Father   . Heart disease Brother   . Drug abuse Brother   . Breast cancer Maternal Aunt    Allergies  Allergen Reactions  . Amoxicillin Anaphylaxis and Swelling    Face/neck swelling fever lips turned blue  . Penicillins Anaphylaxis and Swelling    Face/neck swelling fever lips turned blue    Prior to Admission medications   Medication Sig Start Date End Date Taking? Authorizing Provider  Aspirin-Caffeine (BC FAST PAIN RELIEF PO) Take 1 packet by mouth daily as needed (pain).   Yes [provider]  ibuprofen (ADVIL) 800 MG tablet Take 800 mg by mouth every 8 (eight) hours as needed for moderate pain.    Yes [provider]  Melatonin 10 MG CAPS Take 50 mg by mouth at bedtime as needed (sleep).   Yes [provider]  phenazopyridine (  PYRIDIUM) 200 MG tablet Take 1 tablet (200 mg total) by mouth 3 (three) times daily. 01/08/20  Yes Wieters, Hallie C, PA-C  DULoxetine (CYMBALTA) 30 MG capsule Take 1 capsule (30 mg total) by mouth daily. Patient not taking: Reported on 12/31/2019 11/06/19 01/08/20  Levert Feinstein, MD   No results found.  Positive ROS: All other systems have been reviewed and were otherwise negative with the exception of those mentioned in the HPI and as above.  Physical Exam: General: Alert, no acute distress Cardiovascular: No pedal edema Respiratory: No cyanosis, no use of accessory musculature GI: No organomegaly, abdomen is soft and non-tender Skin: No lesions in the area of chief complaint Neurologic: Sensation intact distally Psychiatric: Patient  is competent for consent with normal mood and affect Lymphatic: No axillary or cervical lymphadenopathy  MUSCULOSKELETAL:  Right upper extremity:  She has previous deltopectoral incision is nicely healed.  No swelling.  No erythema.  Distally neurovascular intact.  Assessment: 1.  Painful right total shoulder arthroplasty 2.  Right shoulder rotator cuff arthropathy  Plan: -Plan to proceed today with conversion of her anatomic shoulder arthroplasty to a reverse shoulder arthroplasty.  We will take deep cultures and follow those closely.  We will also place her on empiric postoperative clindamycin for up to 4 weeks depending on the appearance intraoperatively.  -We discussed the risk of bleeding, infection, damage to surrounding nerves and vessels, dislocation, fracture, failure of improvement in function, persistent pain, and the risk of anesthesia.  She has provided informed consent to proceed. -We will plan for discharge home postoperatively from PACU.    Yolonda Kida, MD Cell [phone]    01/17/2020 12:47 PM

## 2020-01-17 NOTE — Discharge Instructions (Signed)
-  Maintain arm in sling unless doing exercise routine or activities of daily living.  -Maintain postoperative bandage until your follow-up appointment.  You may shower with this bandage in place, but do not submerge underwater.  -For mild to moderate pain use Tylenol and Advil alternating around-the-clock.  For breakthrough pain use oxycodone as necessary.  -Apply ice to the right shoulder for 30 minutes out of each hour that you are able and awake.  She do this around-the-clock.  - complete 14 days of clindamycin for empiric treatment postoperatively  -Return to see Dr. Aundria Rud in 2 weeks for routine postop care.

## 2020-01-22 ENCOUNTER — Encounter (HOSPITAL_COMMUNITY): Payer: Self-pay | Admitting: Orthopedic Surgery

## 2020-01-22 LAB — AEROBIC/ANAEROBIC CULTURE W GRAM STAIN (SURGICAL/DEEP WOUND): Culture: NO GROWTH

## 2020-01-30 DIAGNOSIS — M25511 Pain in right shoulder: Secondary | ICD-10-CM | POA: Diagnosis not present

## 2020-01-30 DIAGNOSIS — Z4789 Encounter for other orthopedic aftercare: Secondary | ICD-10-CM | POA: Diagnosis not present

## 2020-02-12 DIAGNOSIS — M25511 Pain in right shoulder: Secondary | ICD-10-CM | POA: Diagnosis not present

## 2020-02-17 DIAGNOSIS — M25511 Pain in right shoulder: Secondary | ICD-10-CM | POA: Diagnosis not present

## 2020-02-22 IMAGING — DX DG SHOULDER 2+V PORT*R*
1 series · 1 of 1 positions shown · non-contrast
Comparison: CT scan dated 07/26/2017

CLINICAL DATA: Osteoarthritis of the right shoulder. Status post
right total shoulder prosthesis insertion.

EXAM:
PORTABLE RIGHT SHOULDER

[shoulder ap]
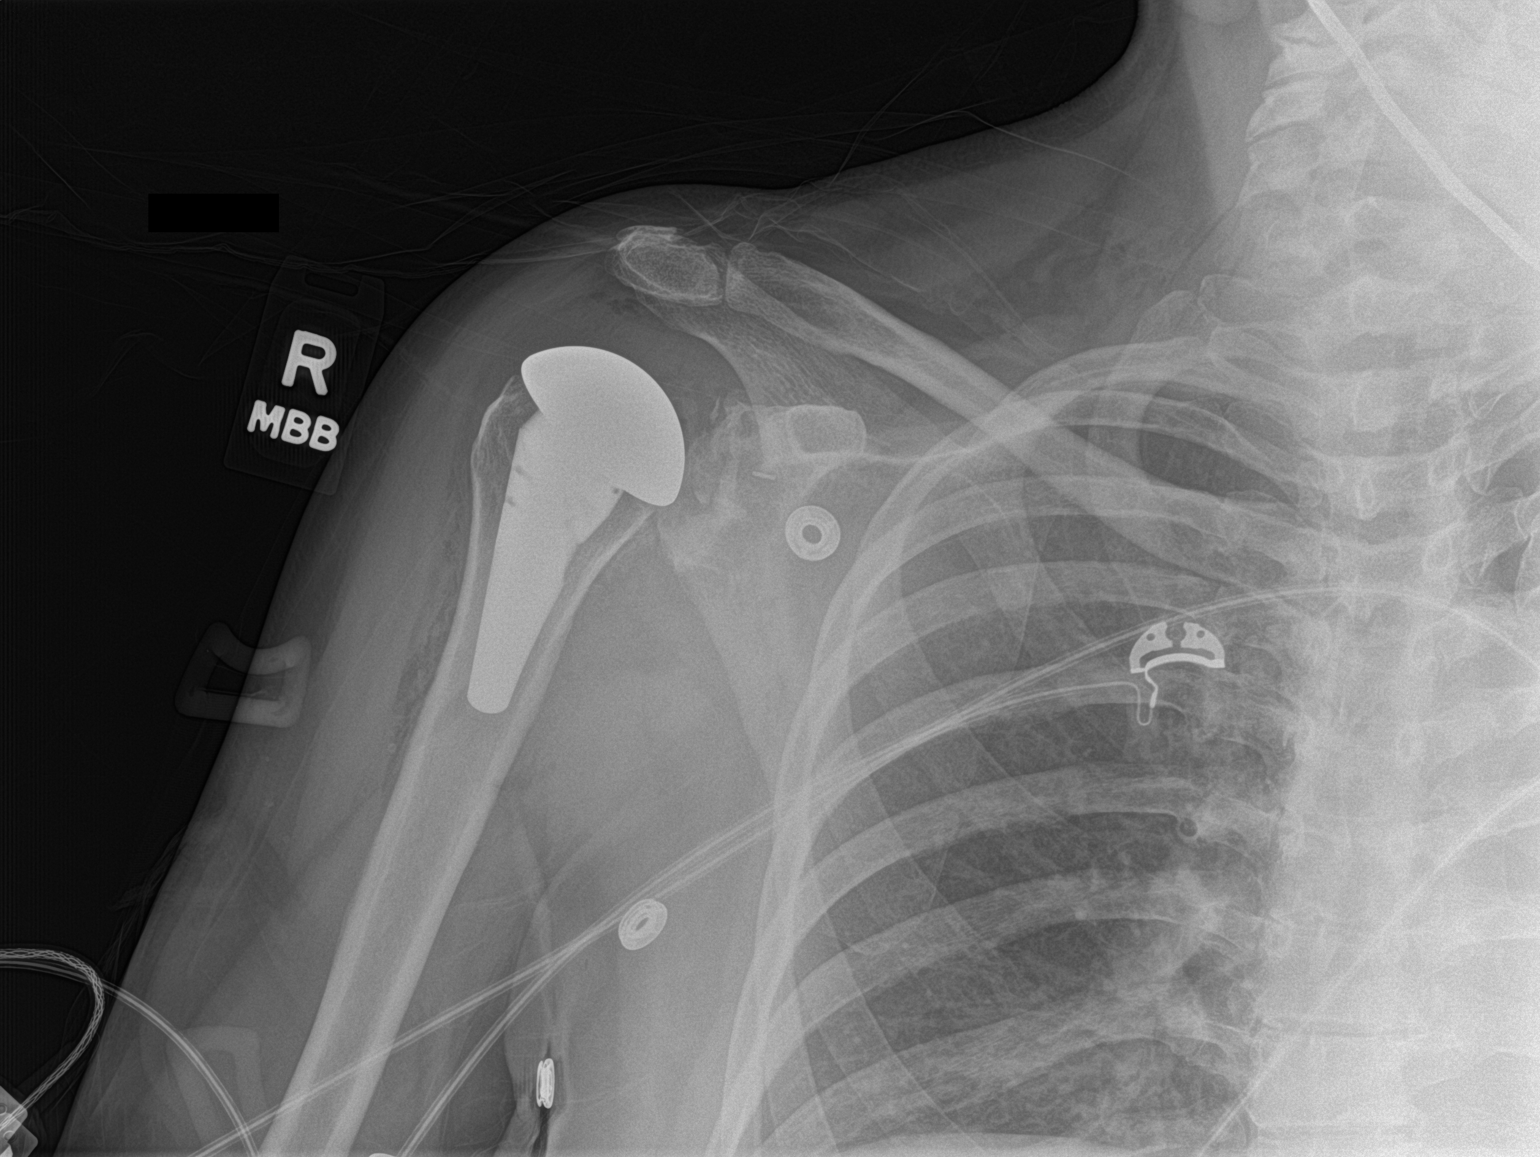

[1 of 1 positions shown; findings below may reference images not displayed]

FINDINGS: Single AP portable view of the right shoulder demonstrates the
patient has undergone right total shoulder prosthesis insertion. No
fracture or dislocation.
IMPRESSION: Satisfactory appearance of the right shoulder in the AP projection
after total shoulder prosthesis insertion.

## 2020-02-26 DIAGNOSIS — Z0000 Encounter for general adult medical examination without abnormal findings: Secondary | ICD-10-CM | POA: Diagnosis not present

## 2020-02-26 DIAGNOSIS — E781 Pure hyperglyceridemia: Secondary | ICD-10-CM | POA: Diagnosis not present

## 2020-02-27 ENCOUNTER — Other Ambulatory Visit: Payer: Self-pay | Admitting: Family Medicine

## 2020-02-27 DIAGNOSIS — Z122 Encounter for screening for malignant neoplasm of respiratory organs: Secondary | ICD-10-CM

## 2020-03-02 DIAGNOSIS — Z4789 Encounter for other orthopedic aftercare: Secondary | ICD-10-CM | POA: Diagnosis not present

## 2020-03-02 DIAGNOSIS — M25611 Stiffness of right shoulder, not elsewhere classified: Secondary | ICD-10-CM | POA: Diagnosis not present

## 2020-03-02 DIAGNOSIS — M25511 Pain in right shoulder: Secondary | ICD-10-CM | POA: Diagnosis not present

## 2020-03-03 ENCOUNTER — Ambulatory Visit
Admission: RE | Admit: 2020-03-03 | Discharge: 2020-03-03 | Disposition: A | Discharge status: Home / Self Care | Payer: BC Managed Care – PPO | Source: Ambulatory Visit | Attending: Family Medicine | Admitting: Family Medicine

## 2020-03-03 DIAGNOSIS — Z122 Encounter for screening for malignant neoplasm of respiratory organs: Secondary | ICD-10-CM

## 2020-03-03 DIAGNOSIS — F17210 Nicotine dependence, cigarettes, uncomplicated: Secondary | ICD-10-CM | POA: Diagnosis not present

## 2020-03-18 DIAGNOSIS — M25611 Stiffness of right shoulder, not elsewhere classified: Secondary | ICD-10-CM | POA: Diagnosis not present

## 2020-03-18 DIAGNOSIS — M25511 Pain in right shoulder: Secondary | ICD-10-CM | POA: Diagnosis not present

## 2020-03-26 ENCOUNTER — Other Ambulatory Visit: Payer: Self-pay | Admitting: Surgery

## 2020-03-26 DIAGNOSIS — E041 Nontoxic single thyroid nodule: Secondary | ICD-10-CM | POA: Diagnosis not present

## 2020-03-31 ENCOUNTER — Ambulatory Visit
Admission: RE | Admit: 2020-03-31 | Discharge: 2020-03-31 | Disposition: A | Discharge status: Home / Self Care | Payer: BC Managed Care – PPO | Source: Ambulatory Visit | Attending: Surgery | Admitting: Surgery

## 2020-03-31 DIAGNOSIS — M25511 Pain in right shoulder: Secondary | ICD-10-CM | POA: Diagnosis not present

## 2020-03-31 DIAGNOSIS — E041 Nontoxic single thyroid nodule: Secondary | ICD-10-CM | POA: Diagnosis not present

## 2020-04-01 DIAGNOSIS — E041 Nontoxic single thyroid nodule: Secondary | ICD-10-CM | POA: Diagnosis not present

## 2020-04-08 DIAGNOSIS — M25511 Pain in right shoulder: Secondary | ICD-10-CM | POA: Diagnosis not present

## 2020-04-09 DIAGNOSIS — Z23 Encounter for immunization: Secondary | ICD-10-CM | POA: Diagnosis not present

## 2020-04-09 DIAGNOSIS — R300 Dysuria: Secondary | ICD-10-CM | POA: Diagnosis not present

## 2020-04-15 DIAGNOSIS — M25512 Pain in left shoulder: Secondary | ICD-10-CM | POA: Diagnosis not present

## 2020-04-15 DIAGNOSIS — Z4789 Encounter for other orthopedic aftercare: Secondary | ICD-10-CM | POA: Diagnosis not present

## 2020-04-28 DIAGNOSIS — M19012 Primary osteoarthritis, left shoulder: Secondary | ICD-10-CM | POA: Diagnosis not present

## 2020-07-16 ENCOUNTER — Other Ambulatory Visit: Payer: Self-pay

## 2020-07-16 ENCOUNTER — Ambulatory Visit
Admission: EM | Admit: 2020-07-16 | Discharge: 2020-07-16 | Disposition: A | Discharge status: Home / Self Care | Payer: BC Managed Care – PPO

## 2020-07-16 DIAGNOSIS — H66002 Acute suppurative otitis media without spontaneous rupture of ear drum, left ear: Secondary | ICD-10-CM | POA: Diagnosis not present

## 2020-07-16 DIAGNOSIS — J0100 Acute maxillary sinusitis, unspecified: Secondary | ICD-10-CM

## 2020-07-16 MED ORDER — DOXYCYCLINE HYCLATE 100 MG PO TABS: 100.0000 mg | ORAL_TABLET | Freq: Two times a day (BID) | ORAL | 0 refills | Status: DC

## 2020-07-16 MED ORDER — PREDNISONE 20 MG PO TABS: 40.0000 mg | ORAL_TABLET | Freq: Every day | ORAL | 0 refills | Status: DC

## 2020-07-16 NOTE — ED Triage Notes (Signed)
Patient presents to Urgent Care with complaints of nasal congestion, dizziness , left ear pain since Sunday. She is concerned with sinus infection. Treating symptoms with theraflu and alka seltzer cold. She states she has a hx of sinus infections.   Denies fever.

## 2020-07-16 NOTE — ED Provider Notes (Signed)
[name]    [name]: [id number] Arrival date & time: 07/16/20  0806      History   Chief Complaint Chief Complaint  Patient presents with   Dizziness   Nasal Congestion    HPI Vicki Pena is a 50 y.o. female.   Patient presenting today with about a week of what started as sore throat, congestion, cough and has developed into left-sided sinus pain, swelling around the eye and maxillary sinus region, sharp ice pick type left ear pain and muffled hearing in the past day or so.  She denies fever, chills, chest pain, shortness of breath, abdominal pain, nausea vomiting or diarrhea.  States multiple family members sick with similar symptoms and had been tested for flu and COVID which were negative so she does not want that today.  She denies any past chronic medical problems, does smoke cigarettes.  So far taking TheraFlu and Alka-Seltzer cold and sinus with no relief.   Past Medical History:  Diagnosis Date   Cervical kyphosis    Chronic neck pain    Complication of anesthesia    Osteoarthritis, shoulder    PONV (postoperative nausea and vomiting)     Patient Active Problem List   Diagnosis Date Noted   Numbness and tingling in right hand 11/06/2019   Chronic right shoulder pain 11/06/2019   Osteoarthritis of right shoulder 08/25/2017   Localized primary osteoarthritis of right shoulder region 07/26/2017    Past Surgical History:  Procedure Laterality Date   ABDOMINAL HYSTERECTOMY     APPENDECTOMY     CESAREAN SECTION     FOOT SURGERY     TONSILLECTOMY     TOTAL SHOULDER ARTHROPLASTY Right 08/25/2017   Procedure: RIGHT TOTAL SHOULDER ARTHROPLASTY;  Surgeon: Yolonda Kida, MD;  Location: [facility] OR;  Service: Orthopedics;  Laterality: Right;   TOTAL SHOULDER REVISION Right 01/17/2020   Procedure: TOTAL SHOULDER REVISION ALL COMPONENTS;  Surgeon: Yolonda Kida, MD;  Location: [facility] ORS;  Service: Orthopedics;  Laterality: Right;  3 hrs Dr. Ranell Patrick to pop  in to assist   TUBAL LIGATION      OB History   No obstetric history on file.      Home Medications    Prior to Admission medications   Medication Sig Start Date End Date Taking? Authorizing Provider  doxycycline (VIBRA-TABS) 100 MG tablet Take 1 tablet (100 mg total) by mouth 2 (two) times daily. 07/16/20  Yes Particia Nearing, PA-C  predniSONE (DELTASONE) 20 MG tablet Take 2 tablets (40 mg total) by mouth daily with breakfast. 07/16/20  Yes Particia Nearing, PA-C  Aspirin-Caffeine [facility] FAST PAIN RELIEF PO) Take 1 packet by mouth daily as needed (pain).    [provider]  ibuprofen (ADVIL) 800 MG tablet Take 800 mg by mouth every 8 (eight) hours as needed for moderate pain.     [provider]  Melatonin 10 MG CAPS Take 50 mg by mouth at bedtime as needed (sleep).    [provider]  ondansetron (ZOFRAN ODT) 4 MG disintegrating tablet Take 1 tablet (4 mg total) by mouth every 8 (eight) hours as needed for nausea or vomiting. 01/17/20   Yolonda Kida, MD  oxyCODONE (ROXICODONE) 5 MG immediate release tablet Take 1 tablet (5 mg total) by mouth every 8 (eight) hours as needed. 01/17/20 01/16/21  Yolonda Kida, MD  phenazopyridine (PYRIDIUM) 200 MG tablet Take 1 tablet (200 mg total) by mouth 3 (three) times daily.  01/08/20   Wieters, Hallie C, PA-C  rosuvastatin (CRESTOR) 5 MG tablet rosuvastatin 5 mg tablet    [provider]  DULoxetine (CYMBALTA) 30 MG capsule Take 1 capsule (30 mg total) by mouth daily. Patient not taking: Reported on 12/31/2019 11/06/19 01/08/20  Levert Feinstein, MD    Family History Family History  Problem Relation Age of Onset   Diabetes Mother    COPD Mother    Heart disease Father    Arthritis Father    Heart disease Brother    Drug abuse Brother    Breast cancer Maternal Aunt     Social History Social History   Tobacco Use   Smoking status: Every Day    Packs/day: 1.00    Years: 15.00    Pack  years: 15.00    Types: Cigarettes   Smokeless tobacco: Never  Vaping Use   Vaping Use: Never used  Substance Use Topics   Alcohol use: Yes    Alcohol/week: 0.0 standard drinks    Comment: occasional   Drug use: No     Allergies   Amoxicillin and Penicillins   Review of Systems Review of Systems Per HPI  Physical Exam Triage Vital Signs ED Triage Vitals  Enc Vitals Group     BP 07/16/20 0828 (!) 144/98     Pulse Rate 07/16/20 0828 98     Resp 07/16/20 0828 16     Temp 07/16/20 0828 98.3 F (36.8 C)     Temp Source 07/16/20 0828 Oral     SpO2 07/16/20 0828 93 %     Weight --      Height --      Head Circumference --      Peak Flow --      Pain Score 07/16/20 0823 0     Pain Loc --      Pain Edu? --      Excl. in GC? --    No data found.  Updated Vital Signs BP (!) 144/98 (BP Location: Left Arm)   Pulse 98   Temp 98.3 F (36.8 C) (Oral)   Resp 16   LMP 02/07/1998   SpO2 93%   Visual Acuity Right Eye Distance:   Left Eye Distance:   Bilateral Distance:    Right Eye Near:   Left Eye Near:    Bilateral Near:     Physical Exam Vitals and nursing note reviewed.  Constitutional:      Appearance: Normal appearance. She is not ill-appearing.  HENT:     Head: Atraumatic.     Ears:     Comments: Right middle ear effusion with mild injection of the TM Left TM erythematous, edematous with purulent fluid    Nose: Congestion present.     Mouth/Throat:     Mouth: Mucous membranes are moist.     Pharynx: Posterior oropharyngeal erythema present. No oropharyngeal exudate.  Eyes:     Extraocular Movements: Extraocular movements intact.     Conjunctiva/sclera: Conjunctivae normal.  Cardiovascular:     Rate and Rhythm: Normal rate and regular rhythm.     Heart sounds: Normal heart sounds.  Pulmonary:     Effort: Pulmonary effort is normal.     Breath sounds: Normal breath sounds. No wheezing or rales.  Musculoskeletal:        General: Normal range of  motion.     Cervical back: Normal range of motion and neck supple.  Skin:    General: Skin is warm  and dry.  Neurological:     Mental Status: She is alert and oriented to person, place, and time.  Psychiatric:        Mood and Affect: Mood normal.        Thought Content: Thought content normal.        Judgment: Judgment normal.   UC Treatments / Results  Labs (all labs ordered are listed, but only abnormal results are displayed) Labs Reviewed - No data to display  EKG  Radiology No results found.  Procedures Procedures (including critical care time)  Medications Ordered in UC Medications - No data to display  Initial Impression / Assessment and Plan / UC Course  I have reviewed the triage vital signs and the nursing notes.  Pertinent labs & imaging results that were available during my care of the patient were reviewed by me and considered in my medical decision making (see chart for details).     Ear infection and developing sinus infection, will treat with prednisone, doxycycline, Mucinex, sinus rinses and nasal sprays.  Declines COVID testing today as some members have been negative and already past the quarantine timeframe anyways.  Discussed return precautions for worsening or unresolving symptoms.  Final Clinical Impressions(s) / UC Diagnoses   Final diagnoses:  Acute suppurative otitis media of left ear without spontaneous rupture of tympanic membrane, recurrence not specified  Acute non-recurrent maxillary sinusitis   Discharge Instructions   None    ED Prescriptions     Medication Sig Dispense Auth. Provider   doxycycline (VIBRA-TABS) 100 MG tablet Take 1 tablet (100 mg total) by mouth 2 (two) times daily. 14 tablet Particia Nearing, [place]   predniSONE (DELTASONE) 20 MG tablet Take 2 tablets (40 mg total) by mouth daily with breakfast. 10 tablet Particia Nearing, [place]      PDMP not reviewed this encounter.   Roosvelt Maser Fallon,  [place] 07/16/20 [phone]

## 2020-07-23 DIAGNOSIS — H6980 Other specified disorders of Eustachian tube, unspecified ear: Secondary | ICD-10-CM | POA: Diagnosis not present

## 2020-08-05 DIAGNOSIS — M25511 Pain in right shoulder: Secondary | ICD-10-CM | POA: Diagnosis not present

## 2020-09-02 DIAGNOSIS — D127 Benign neoplasm of rectosigmoid junction: Secondary | ICD-10-CM | POA: Diagnosis not present

## 2020-09-02 DIAGNOSIS — D123 Benign neoplasm of transverse colon: Secondary | ICD-10-CM | POA: Diagnosis not present

## 2020-09-02 DIAGNOSIS — K621 Rectal polyp: Secondary | ICD-10-CM | POA: Diagnosis not present

## 2020-09-02 DIAGNOSIS — K648 Other hemorrhoids: Secondary | ICD-10-CM | POA: Diagnosis not present

## 2020-09-02 DIAGNOSIS — K5730 Diverticulosis of large intestine without perforation or abscess without bleeding: Secondary | ICD-10-CM | POA: Diagnosis not present

## 2020-09-02 DIAGNOSIS — K644 Residual hemorrhoidal skin tags: Secondary | ICD-10-CM | POA: Diagnosis not present

## 2020-09-02 DIAGNOSIS — Z1211 Encounter for screening for malignant neoplasm of colon: Secondary | ICD-10-CM | POA: Diagnosis not present

## 2020-09-07 DIAGNOSIS — N3090 Cystitis, unspecified without hematuria: Secondary | ICD-10-CM | POA: Diagnosis not present

## 2020-09-07 DIAGNOSIS — J40 Bronchitis, not specified as acute or chronic: Secondary | ICD-10-CM | POA: Diagnosis not present

## 2020-10-29 ENCOUNTER — Ambulatory Visit (HOSPITAL_COMMUNITY)
Admission: EM | Admit: 2020-10-29 | Discharge: 2020-10-29 | Disposition: A | Discharge status: Home / Self Care | Payer: BC Managed Care – PPO | Attending: Internal Medicine | Admitting: Internal Medicine

## 2020-10-29 ENCOUNTER — Other Ambulatory Visit: Payer: Self-pay

## 2020-10-29 ENCOUNTER — Encounter (HOSPITAL_COMMUNITY): Payer: Self-pay | Admitting: *Deleted

## 2020-10-29 DIAGNOSIS — N3001 Acute cystitis with hematuria: Secondary | ICD-10-CM | POA: Diagnosis not present

## 2020-10-29 LAB — POCT URINALYSIS DIPSTICK, ED / UC
Bilirubin Urine: NEGATIVE
Glucose, UA: NEGATIVE mg/dL
Ketones, ur: NEGATIVE mg/dL
Nitrite: NEGATIVE
Protein, ur: NEGATIVE mg/dL
Specific Gravity, Urine: 1.015 (ref 1.005–1.030)
Urobilinogen, UA: 0.2 mg/dL (ref 0.0–1.0)
pH: 7 (ref 5.0–8.0)

## 2020-10-29 MED ORDER — PHENAZOPYRIDINE HCL 200 MG PO TABS: 200.0000 mg | ORAL_TABLET | Freq: Three times a day (TID) | ORAL | 0 refills | Status: DC

## 2020-10-29 MED ORDER — CIPROFLOXACIN HCL 500 MG PO TABS: 500.0000 mg | ORAL_TABLET | Freq: Two times a day (BID) | ORAL | 0 refills | Status: DC

## 2020-10-29 NOTE — Discharge Instructions (Addendum)
Take antibiotics as prescribed. Follow up with any further concerns. Will notify of urine culture results if positive.

## 2020-10-29 NOTE — ED Provider Notes (Signed)
[facility]    [name]: [id number] Arrival date & time: 10/29/20  0809      History   Chief Complaint Chief Complaint  Patient presents with   Urinary Frequency   UTI    HPI Vicki Pena is a 50 y.o. female.   Patient here today for evaluation of possible UTI.  She reports that she woke up about 5 hours ago with dysuria, bladder spasms, and urinary urgency.  She states these are typical symptoms for her UTIs she has had in the past.  She has not tried any treatment for symptoms.  She has had some mild nausea but no vomiting.  She denies any back pain.  She states she has had some chills but no fever.   Urinary Frequency Associated symptoms include abdominal pain. Pertinent negatives include no chest pain and no shortness of breath.   Past Medical History:  Diagnosis Date   Cervical kyphosis    Chronic neck pain    Complication of anesthesia    Osteoarthritis, shoulder    PONV (postoperative nausea and vomiting)     Patient Active Problem List   Diagnosis Date Noted   Numbness and tingling in right hand 11/06/2019   Chronic right shoulder pain 11/06/2019   Osteoarthritis of right shoulder 08/25/2017   Localized primary osteoarthritis of right shoulder region 07/26/2017    Past Surgical History:  Procedure Laterality Date   ABDOMINAL HYSTERECTOMY     APPENDECTOMY     CESAREAN SECTION     FOOT SURGERY     TONSILLECTOMY     TOTAL SHOULDER ARTHROPLASTY Right 08/25/2017   Procedure: RIGHT TOTAL SHOULDER ARTHROPLASTY;  Surgeon: Yolonda Kida, MD;  Location: [facility] OR;  Service: Orthopedics;  Laterality: Right;   TOTAL SHOULDER REVISION Right 01/17/2020   Procedure: TOTAL SHOULDER REVISION ALL COMPONENTS;  Surgeon: Yolonda Kida, MD;  Location: [facility] OR[facility];  Service: Orthopedics;  Laterality: Right;  3 hrs Dr. Ranell Patrick to pop in to assist   TUBAL LIGATION      OB History   No obstetric history on file.      Home Medications    Prior to Admission  medications   Medication Sig Start Date End Date Taking? Authorizing Provider  ciprofloxacin (CIPRO) 500 MG tablet Take 1 tablet (500 mg total) by mouth every 12 (twelve) hours. 10/29/20  Yes Tomi Bamberger, PA-C  phenazopyridine (PYRIDIUM) 200 MG tablet Take 1 tablet (200 mg total) by mouth 3 (three) times daily. 10/29/20  Yes Tomi Bamberger, PA-C  Aspirin-Caffeine (BC FAST PAIN RELIEF PO) Take 1 packet by mouth daily as needed (pain).    [provider]  doxycycline (VIBRA-TABS) 100 MG tablet Take 1 tablet (100 mg total) by mouth 2 (two) times daily. 07/16/20   Particia Nearing, PA-C  ibuprofen (ADVIL) 800 MG tablet Take 800 mg by mouth every 8 (eight) hours as needed for moderate pain.     [provider]  Melatonin 10 MG CAPS Take 50 mg by mouth at bedtime as needed (sleep).    [provider]  ondansetron (ZOFRAN ODT) 4 MG disintegrating tablet Take 1 tablet (4 mg total) by mouth every 8 (eight) hours as needed for nausea or vomiting. 01/17/20   Yolonda Kida, MD  oxyCODONE (ROXICODONE) 5 MG immediate release tablet Take 1 tablet (5 mg total) by mouth every 8 (eight) hours as needed. 01/17/20 01/16/21  Yolonda Kida, MD  predniSONE (DELTASONE) 20 MG tablet Take  2 tablets (40 mg total) by mouth daily with breakfast. 07/16/20   Particia Nearing, PA-C  rosuvastatin (CRESTOR) 5 MG tablet rosuvastatin 5 mg tablet    [provider]  DULoxetine (CYMBALTA) 30 MG capsule Take 1 capsule (30 mg total) by mouth daily. Patient not taking: Reported on 12/31/2019 11/06/19 01/08/20  Levert Feinstein, MD    Family History Family History  Problem Relation Age of Onset   Diabetes Mother    COPD Mother    Heart disease Father    Arthritis Father    Heart disease Brother    Drug abuse Brother    Breast cancer Maternal Aunt     Social History Social History   Tobacco Use   Smoking status: Every Day    Packs/day: 1.00    Years: 15.00    Pack  years: 15.00    Types: Cigarettes   Smokeless tobacco: Never  Vaping Use   Vaping Use: Never used  Substance Use Topics   Alcohol use: Yes    Alcohol/week: 0.0 standard drinks    Comment: occasional   Drug use: No     Allergies   Amoxicillin and Penicillins   Review of Systems Review of Systems  Constitutional:  Negative for chills and fever.  Respiratory:  Negative for shortness of breath.   Cardiovascular:  Negative for chest pain.  Gastrointestinal:  Positive for abdominal pain. Negative for nausea and vomiting.  Genitourinary:  Positive for dysuria, frequency and urgency.  Musculoskeletal:  Negative for back pain.    Physical Exam Triage Vital Signs ED Triage Vitals [10/29/20 0833]  Enc Vitals Group     BP 129/70     Pulse Rate 84     Resp 18     Temp 98.3 F (36.8 C)     Temp src      SpO2 100 %     Weight      Height      Head Circumference      Peak Flow      Pain Score 0     Pain Loc      Pain Edu?      Excl. in GC?    No data found.  Updated Vital Signs BP 129/70   Pulse 84   Temp 98.3 F (36.8 C)   Resp 18   LMP 02/07/1998   SpO2 100%   Physical Exam Vitals and nursing note reviewed.  Constitutional:      General: She is not in acute distress.    Appearance: Normal appearance. She is not ill-appearing.  HENT:     Head: Normocephalic and atraumatic.  Eyes:     Conjunctiva/sclera: Conjunctivae normal.  Cardiovascular:     Rate and Rhythm: Normal rate and regular rhythm.     Heart sounds: Normal heart sounds. No murmur heard. Pulmonary:     Effort: Pulmonary effort is normal.     Breath sounds: Normal breath sounds. No wheezing, rhonchi or rales.  Abdominal:     General: Abdomen is flat. There is no distension.     Palpations: Abdomen is soft.     Tenderness: There is abdominal tenderness (mild suprapubic TTP). There is no right CVA tenderness, left CVA tenderness, guarding or rebound.  Skin:    General: Skin is warm and dry.   Neurological:     Mental Status: She is alert.  Psychiatric:        Mood and Affect: Mood normal.  Behavior: Behavior normal.        Thought Content: Thought content normal.     UC Treatments / Results  Labs (all labs ordered are listed, but only abnormal results are displayed) Labs Reviewed  POCT URINALYSIS DIPSTICK, ED / UC - Abnormal; Notable for the following components:      Result Value   Hgb urine dipstick SMALL (*)    Leukocytes,Ua SMALL (*)    All other components within normal limits  URINE CULTURE    EKG   Radiology No results found.  Procedures Procedures (including critical care time)  Medications Ordered in UC Medications - No data to display  Initial Impression / Assessment and Plan / UC Course  I have reviewed the triage vital signs and the nursing notes.  Pertinent labs & imaging results that were available during my care of the patient were reviewed by me and considered in my medical decision making (see chart for details).  Symptoms consistent with UTI and UA with small leukocytes, will treat to cover UTI with Cipro.  Patient requests Pyridium as well as this is worked well for her before.  Encouraged follow-up if symptoms fail to improve or worsen.  We will notify of urine culture results once available.  Final Clinical Impressions(s) / UC Diagnoses   Final diagnoses:  Acute cystitis with hematuria     Discharge Instructions      Take antibiotics as prescribed. Follow up with any further concerns. Will notify of urine culture results if positive.      ED Prescriptions     Medication Sig Dispense Auth. Provider   ciprofloxacin (CIPRO) 500 MG tablet Take 1 tablet (500 mg total) by mouth every 12 (twelve) hours. 10 tablet Tomi Bamberger, PA-C   phenazopyridine (PYRIDIUM) 200 MG tablet Take 1 tablet (200 mg total) by mouth 3 (three) times daily. 6 tablet Tomi Bamberger, PA-C      PDMP not reviewed this encounter.   Tomi Bamberger, PA-C 10/29/20 [phone]

## 2020-10-29 NOTE — ED Triage Notes (Signed)
Pt reports frquency and pressure started today. Pt with of UTIs

## 2020-10-31 LAB — URINE CULTURE: Culture: 30000 — AB

## 2020-11-17 DIAGNOSIS — M25512 Pain in left shoulder: Secondary | ICD-10-CM | POA: Diagnosis not present

## 2020-11-24 DIAGNOSIS — N390 Urinary tract infection, site not specified: Secondary | ICD-10-CM | POA: Diagnosis not present

## 2020-11-24 DIAGNOSIS — Z23 Encounter for immunization: Secondary | ICD-10-CM | POA: Diagnosis not present

## 2020-12-02 IMAGING — US ULTRASOUND RIGHT BREAST LIMITED
1 series · 10 of 10 positions shown · non-contrast
Comparison: None.

CLINICAL DATA: 47-year-old female presenting for evaluation of a
palpable lump in the left breast.

EXAM:
DIGITAL DIAGNOSTIC BILATERAL MAMMOGRAM WITH CAD AND TOMO
ULTRASOUND BILATERAL BREAST

[Series 1: ultrasound right breast limited · 0.05mm/px · 10 of 10 slices shown]
[im 1/10]
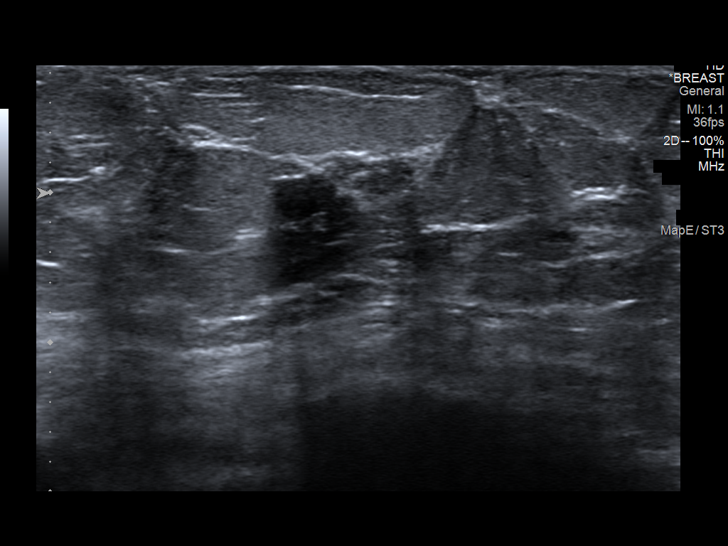
[im 2/10]
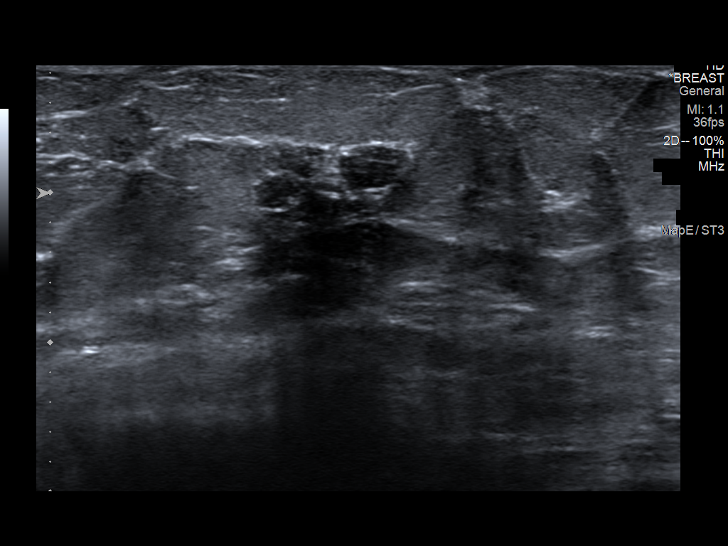
[im 3/10]
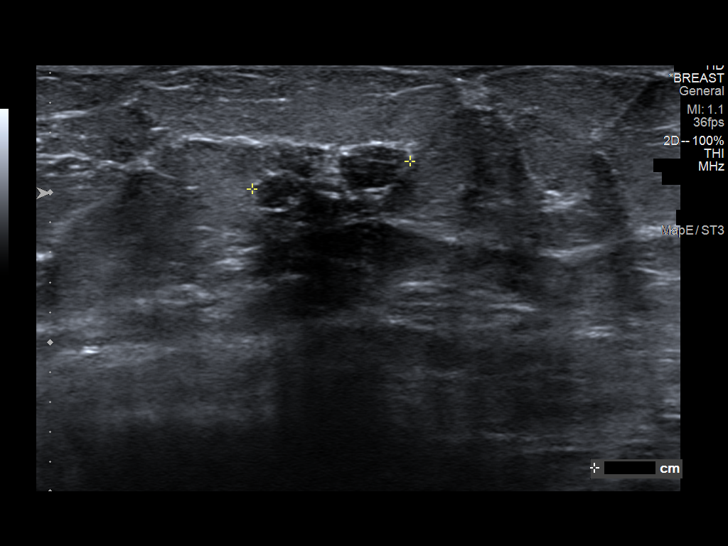
[im 4/10]
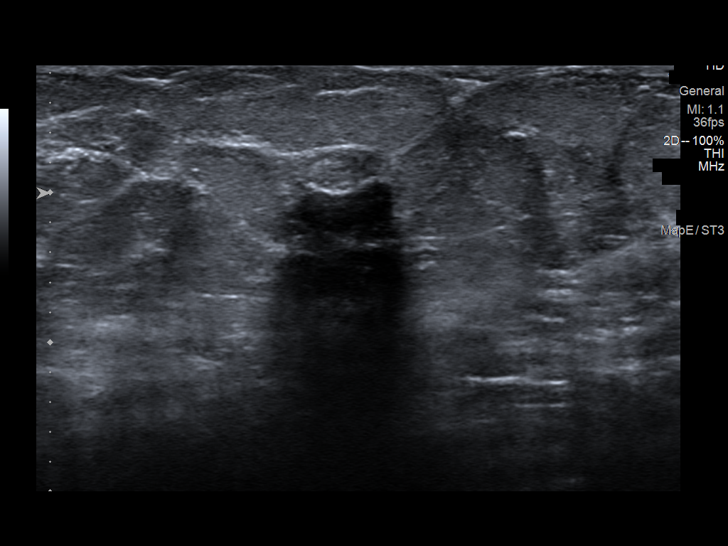
[im 5/10]
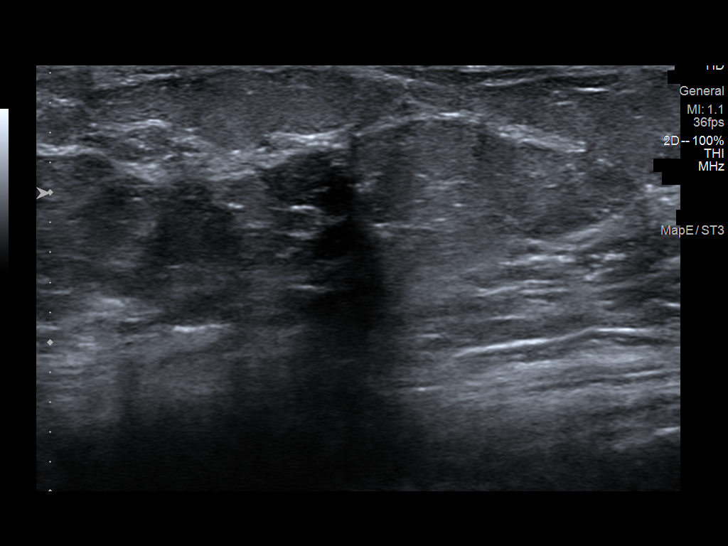
[im 6/10]
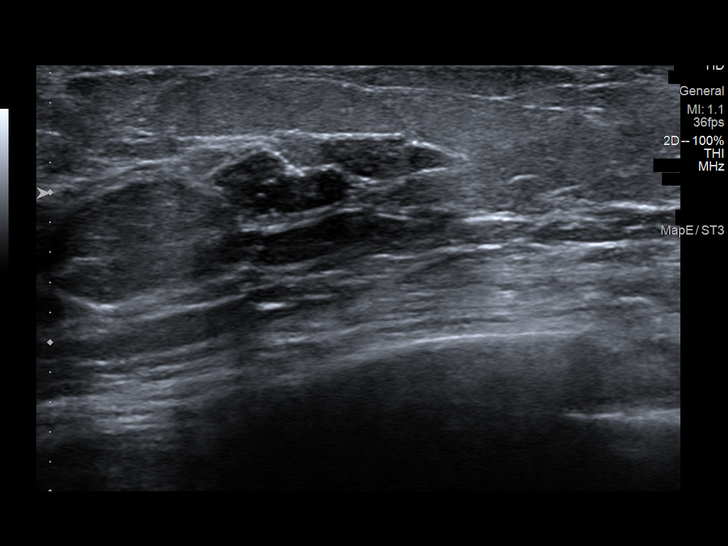
[im 7/10]
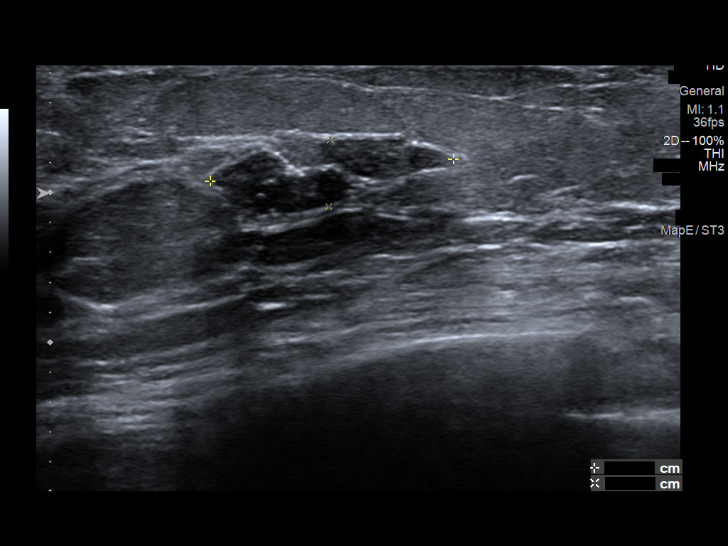
[im 8/10]
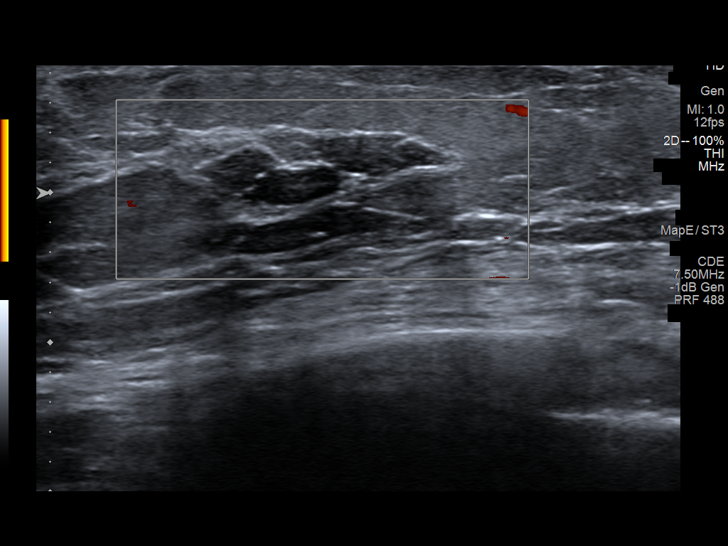
[im 9/10]
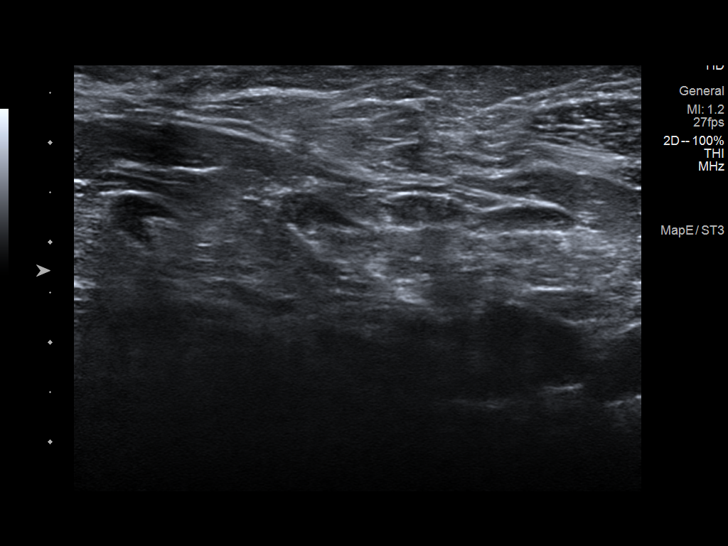
[im 10/10]
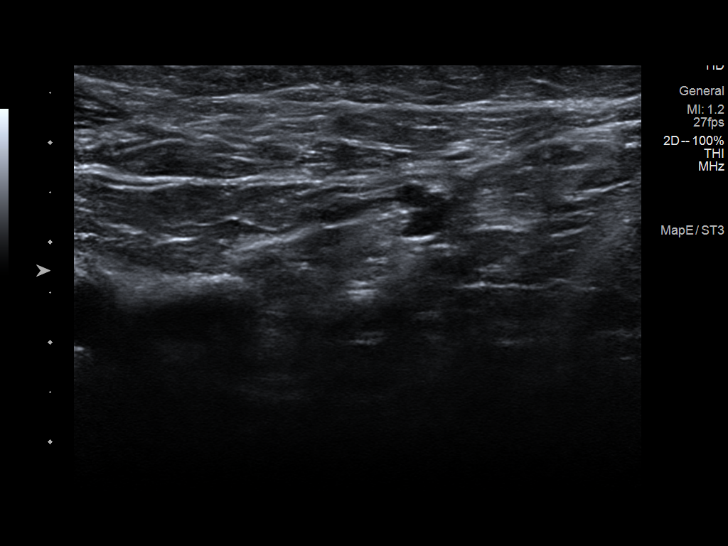

[10 of 10 positions shown; findings below may reference images not displayed]

ACR Breast Density Category b: There are scattered areas of
fibroglandular density.
FINDINGS: A BB indicating the palpable site of concern has been placed along
the upper inner aspect of the left breast. There are no suspicious
mammographic findings deep to this palpable marker. In the
lower-inner quadrant of the right breast, mid to anterior depth,
there is a low-density lobulated mass measuring 1.9 cm. No other
suspicious calcifications, masses or areas of distortion are seen in
the bilateral breasts.

Mammographic images were processed with CAD.

Exam of the palpable site in the superior left breast demonstrates
no discrete palpable masses. The region of concern identified by the
patient likely represents a fat lobule.

Ultrasound targeted to the palpable site in the superior left breast
demonstrates normal fibroglandular tissue. No masses or suspicious
areas of shadowing are identified.

Ultrasound of the right breast at 3 o'clock, 2 cm from the nipple
demonstrates a cluster of hypoechoic masses together measuring 1.6 x
0.5 x 1.1 cm. There is some associated posterior acoustic shadowing
in some of the images. Ultrasound of the right axilla demonstrates
multiple normal-appearing lymph nodes.
IMPRESSION: 1. There are no suspicious mammographic or targeted sonographic
abnormalities at the palpable site in the upper inner left breast.

2. There is a 1.6 cm cluster of indeterminate masses in the right
breast at 3 o'clock. While these may represent clustered
fibroadenomas or papillomas, malignancy cannot be entirely excluded.

3.  No evidence of right axillary lymphadenopathy.

RECOMMENDATION:
1. Ultrasound-guided biopsy is recommended for the right breast mass
at 3 o'clock. This has been scheduled for 06/11/2018 at [DATE] p.m.

2. Clinical follow-up recommended for the palpable area of concern
in the left breast. Any further workup should be based on clinical
grounds.

I have discussed the findings and recommendations with the patient.
Results were also provided in writing at the conclusion of the
visit. If applicable, a reminder letter will be sent to the patient
regarding the next appointment.

In the left breast.

BI-RADS CATEGORY  4: Suspicious.

## 2020-12-14 DIAGNOSIS — F17200 Nicotine dependence, unspecified, uncomplicated: Secondary | ICD-10-CM | POA: Diagnosis not present

## 2020-12-14 DIAGNOSIS — J0110 Acute frontal sinusitis, unspecified: Secondary | ICD-10-CM | POA: Diagnosis not present

## 2021-01-07 DIAGNOSIS — N393 Stress incontinence (female) (male): Secondary | ICD-10-CM | POA: Diagnosis not present

## 2021-01-07 DIAGNOSIS — R310 Gross hematuria: Secondary | ICD-10-CM | POA: Diagnosis not present

## 2021-01-07 DIAGNOSIS — R8271 Bacteriuria: Secondary | ICD-10-CM | POA: Diagnosis not present

## 2021-01-07 DIAGNOSIS — N390 Urinary tract infection, site not specified: Secondary | ICD-10-CM | POA: Diagnosis not present

## 2021-02-15 ENCOUNTER — Other Ambulatory Visit: Payer: Self-pay | Admitting: Surgery

## 2021-02-15 DIAGNOSIS — E041 Nontoxic single thyroid nodule: Secondary | ICD-10-CM

## 2021-03-02 DIAGNOSIS — Z0000 Encounter for general adult medical examination without abnormal findings: Secondary | ICD-10-CM | POA: Diagnosis not present

## 2021-03-02 DIAGNOSIS — E785 Hyperlipidemia, unspecified: Secondary | ICD-10-CM | POA: Diagnosis not present

## 2021-03-02 DIAGNOSIS — Z23 Encounter for immunization: Secondary | ICD-10-CM | POA: Diagnosis not present

## 2021-03-02 DIAGNOSIS — F17200 Nicotine dependence, unspecified, uncomplicated: Secondary | ICD-10-CM | POA: Diagnosis not present

## 2021-03-02 DIAGNOSIS — I700 Atherosclerosis of aorta: Secondary | ICD-10-CM | POA: Diagnosis not present

## 2021-03-16 DIAGNOSIS — H04123 Dry eye syndrome of bilateral lacrimal glands: Secondary | ICD-10-CM | POA: Diagnosis not present

## 2021-03-30 ENCOUNTER — Other Ambulatory Visit: Payer: Self-pay | Admitting: Physician Assistant

## 2021-03-30 DIAGNOSIS — Z1231 Encounter for screening mammogram for malignant neoplasm of breast: Secondary | ICD-10-CM

## 2021-04-09 DIAGNOSIS — M19012 Primary osteoarthritis, left shoulder: Secondary | ICD-10-CM | POA: Diagnosis not present

## 2021-04-09 DIAGNOSIS — M19011 Primary osteoarthritis, right shoulder: Secondary | ICD-10-CM | POA: Diagnosis not present

## 2021-05-05 ENCOUNTER — Ambulatory Visit: Payer: BC Managed Care – PPO

## 2021-05-05 ENCOUNTER — Ambulatory Visit
Admission: RE | Admit: 2021-05-05 | Discharge: 2021-05-05 | Disposition: A | Discharge status: Home / Self Care | Payer: BC Managed Care – PPO | Source: Ambulatory Visit | Attending: Physician Assistant | Admitting: Physician Assistant

## 2021-05-05 DIAGNOSIS — M5030 Other cervical disc degeneration, unspecified cervical region: Secondary | ICD-10-CM | POA: Diagnosis not present

## 2021-05-05 DIAGNOSIS — M9901 Segmental and somatic dysfunction of cervical region: Secondary | ICD-10-CM | POA: Diagnosis not present

## 2021-05-05 DIAGNOSIS — M5090 Cervical disc disorder, unspecified, unspecified cervical region: Secondary | ICD-10-CM | POA: Diagnosis not present

## 2021-05-05 DIAGNOSIS — M19012 Primary osteoarthritis, left shoulder: Secondary | ICD-10-CM | POA: Diagnosis not present

## 2021-05-05 DIAGNOSIS — Z1231 Encounter for screening mammogram for malignant neoplasm of breast: Secondary | ICD-10-CM

## 2021-05-06 DIAGNOSIS — M19011 Primary osteoarthritis, right shoulder: Secondary | ICD-10-CM | POA: Diagnosis not present

## 2021-05-06 DIAGNOSIS — M19012 Primary osteoarthritis, left shoulder: Secondary | ICD-10-CM | POA: Diagnosis not present

## 2021-06-07 DIAGNOSIS — R300 Dysuria: Secondary | ICD-10-CM | POA: Diagnosis not present

## 2021-06-07 DIAGNOSIS — R3915 Urgency of urination: Secondary | ICD-10-CM | POA: Diagnosis not present

## 2021-06-07 DIAGNOSIS — N3001 Acute cystitis with hematuria: Secondary | ICD-10-CM | POA: Diagnosis not present

## 2021-06-07 DIAGNOSIS — R350 Frequency of micturition: Secondary | ICD-10-CM | POA: Diagnosis not present

## 2021-06-07 DIAGNOSIS — R399 Unspecified symptoms and signs involving the genitourinary system: Secondary | ICD-10-CM | POA: Diagnosis not present

## 2021-06-16 DIAGNOSIS — R399 Unspecified symptoms and signs involving the genitourinary system: Secondary | ICD-10-CM | POA: Diagnosis not present

## 2021-07-26 DIAGNOSIS — N390 Urinary tract infection, site not specified: Secondary | ICD-10-CM | POA: Diagnosis not present

## 2021-09-14 DIAGNOSIS — R399 Unspecified symptoms and signs involving the genitourinary system: Secondary | ICD-10-CM | POA: Diagnosis not present

## 2021-09-14 DIAGNOSIS — R109 Unspecified abdominal pain: Secondary | ICD-10-CM | POA: Diagnosis not present

## 2021-09-14 DIAGNOSIS — R300 Dysuria: Secondary | ICD-10-CM | POA: Diagnosis not present

## 2021-09-21 DIAGNOSIS — N12 Tubulo-interstitial nephritis, not specified as acute or chronic: Secondary | ICD-10-CM | POA: Diagnosis not present

## 2021-09-22 DIAGNOSIS — N952 Postmenopausal atrophic vaginitis: Secondary | ICD-10-CM | POA: Diagnosis not present

## 2021-09-22 DIAGNOSIS — N393 Stress incontinence (female) (male): Secondary | ICD-10-CM | POA: Diagnosis not present

## 2021-09-22 DIAGNOSIS — N390 Urinary tract infection, site not specified: Secondary | ICD-10-CM | POA: Diagnosis not present

## 2021-09-22 DIAGNOSIS — K649 Unspecified hemorrhoids: Secondary | ICD-10-CM | POA: Diagnosis not present

## 2021-09-23 DIAGNOSIS — N390 Urinary tract infection, site not specified: Secondary | ICD-10-CM | POA: Diagnosis not present

## 2021-09-23 DIAGNOSIS — N281 Cyst of kidney, acquired: Secondary | ICD-10-CM | POA: Diagnosis not present

## 2021-10-06 DIAGNOSIS — M19011 Primary osteoarthritis, right shoulder: Secondary | ICD-10-CM | POA: Diagnosis not present

## 2021-10-06 DIAGNOSIS — M19012 Primary osteoarthritis, left shoulder: Secondary | ICD-10-CM | POA: Diagnosis not present

## 2021-11-01 DIAGNOSIS — N390 Urinary tract infection, site not specified: Secondary | ICD-10-CM | POA: Diagnosis not present

## 2021-11-09 DIAGNOSIS — N952 Postmenopausal atrophic vaginitis: Secondary | ICD-10-CM | POA: Diagnosis not present

## 2021-11-09 DIAGNOSIS — N281 Cyst of kidney, acquired: Secondary | ICD-10-CM | POA: Diagnosis not present

## 2021-11-09 DIAGNOSIS — R634 Abnormal weight loss: Secondary | ICD-10-CM | POA: Diagnosis not present

## 2021-11-09 DIAGNOSIS — Z87440 Personal history of urinary (tract) infections: Secondary | ICD-10-CM | POA: Diagnosis not present

## 2021-11-30 DIAGNOSIS — N281 Cyst of kidney, acquired: Secondary | ICD-10-CM | POA: Diagnosis not present

## 2022-01-03 DIAGNOSIS — U071 COVID-19: Secondary | ICD-10-CM | POA: Diagnosis not present

## 2022-02-18 ENCOUNTER — Other Ambulatory Visit: Payer: Self-pay

## 2022-02-18 ENCOUNTER — Emergency Department (HOSPITAL_COMMUNITY): Payer: BC Managed Care – PPO

## 2022-02-18 ENCOUNTER — Emergency Department (HOSPITAL_COMMUNITY)
Admission: EM | Admit: 2022-02-18 | Discharge: 2022-02-19 | Discharge status: Against Medical Advice | Payer: BC Managed Care – PPO | Attending: Emergency Medicine | Admitting: Emergency Medicine

## 2022-02-18 ENCOUNTER — Encounter (HOSPITAL_COMMUNITY): Payer: Self-pay

## 2022-02-18 DIAGNOSIS — R0602 Shortness of breath: Secondary | ICD-10-CM | POA: Diagnosis not present

## 2022-02-18 DIAGNOSIS — R42 Dizziness and giddiness: Secondary | ICD-10-CM | POA: Insufficient documentation

## 2022-02-18 DIAGNOSIS — R079 Chest pain, unspecified: Secondary | ICD-10-CM | POA: Diagnosis not present

## 2022-02-18 DIAGNOSIS — M25512 Pain in left shoulder: Secondary | ICD-10-CM | POA: Insufficient documentation

## 2022-02-18 DIAGNOSIS — R0789 Other chest pain: Secondary | ICD-10-CM | POA: Diagnosis not present

## 2022-02-18 DIAGNOSIS — Z5321 Procedure and treatment not carried out due to patient leaving prior to being seen by health care provider: Secondary | ICD-10-CM | POA: Diagnosis not present

## 2022-02-18 LAB — BASIC METABOLIC PANEL
Anion gap: 12 (ref 5–15)
BUN: 17 mg/dL (ref 6–20)
CO2: 24 mmol/L (ref 22–32)
Calcium: 9.4 mg/dL (ref 8.9–10.3)
Chloride: 102 mmol/L (ref 98–111)
Creatinine, Ser: 0.62 mg/dL (ref 0.44–1.00)
GFR, Estimated: >60 mL/min (ref >60)
Glucose, Bld: 92 mg/dL (ref 70–99)
Potassium: 3.5 mmol/L (ref 3.5–5.1)
Sodium: 138 mmol/L (ref 135–145)

## 2022-02-18 LAB — CBC
HCT: 43.9 % (ref 36.0–46.0)
Hemoglobin: 14.6 g/dL (ref 12.0–15.0)
MCH: 31.9 pg (ref 26.0–34.0)
MCHC: 33.3 g/dL (ref 30.0–36.0)
MCV: 96.1 fL (ref 80.0–100.0)
Platelets: 347 10*3/uL (ref 150–400)
RBC: 4.57 MIL/uL (ref 3.87–5.11)
RDW: 13.3 % (ref 11.5–15.5)
WBC: 9.7 10*3/uL (ref 4.0–10.5)
nRBC: 0 % (ref 0.0–0.2)

## 2022-02-18 LAB — TROPONIN I (HIGH SENSITIVITY): Troponin I (High Sensitivity): 2 ng/L (ref <18)

## 2022-02-18 NOTE — ED Provider Triage Note (Signed)
Emergency Medicine Provider Triage Evaluation Note  Vicki Pena , a 52 y.o. female  was evaluated in triage.  Pt complains of left-sided chest pain with radiation to the left shoulder.  Began at approximately 6:00 this morning.  Denies history of similar.  Denies cardiac history.  States it has been constant since that time.  Describes it as a dull ache.  Rates it at a 5 out of 10.  Took Tylenol with no relief.  Review of Systems  Positive: As above Negative: As above  Physical Exam  BP (!) 160/100   Pulse 99   Temp 98.3 F (36.8 C) (Oral)   Resp 18   Ht 5\' 3"  (1.6 m)   Wt 60.8 kg   LMP 02/07/1998   SpO2 100%   BMI 23.74 kg/m  Gen:   Awake, no distress   Resp:  Normal effort  MSK:   Moves extremities without difficulty  Other:    Medical Decision Making  Medically screening exam initiated at 2:04 PM.  Appropriate orders placed.  Ione A Coltrane was informed that the remainder of the evaluation will be completed by another provider, this initial triage assessment does not replace that evaluation, and the importance of remaining in the ED until their evaluation is complete.  ACS rule out   Roylene Reason, Hershal Coria 02/18/22 1404

## 2022-02-18 NOTE — ED Triage Notes (Signed)
Patient reports that she began having left chest pain, SOB, dizziness, and pain in her left shoulder and left arm since 0600 today. Patient states the pain has been constant.

## 2022-02-25 DIAGNOSIS — J069 Acute upper respiratory infection, unspecified: Secondary | ICD-10-CM | POA: Diagnosis not present

## 2022-02-25 DIAGNOSIS — U071 COVID-19: Secondary | ICD-10-CM | POA: Diagnosis not present

## 2022-03-09 ENCOUNTER — Other Ambulatory Visit: Payer: Self-pay | Admitting: Family Medicine

## 2022-03-09 DIAGNOSIS — F17200 Nicotine dependence, unspecified, uncomplicated: Secondary | ICD-10-CM

## 2022-03-09 DIAGNOSIS — Z0000 Encounter for general adult medical examination without abnormal findings: Secondary | ICD-10-CM | POA: Diagnosis not present

## 2022-03-09 DIAGNOSIS — I700 Atherosclerosis of aorta: Secondary | ICD-10-CM | POA: Diagnosis not present

## 2022-03-09 DIAGNOSIS — E785 Hyperlipidemia, unspecified: Secondary | ICD-10-CM | POA: Diagnosis not present

## 2022-03-09 DIAGNOSIS — Z23 Encounter for immunization: Secondary | ICD-10-CM | POA: Diagnosis not present

## 2022-05-03 ENCOUNTER — Inpatient Hospital Stay: Admission: RE | Admit: 2022-05-03 | Payer: BC Managed Care – PPO | Source: Ambulatory Visit

## 2022-08-17 DIAGNOSIS — M19012 Primary osteoarthritis, left shoulder: Secondary | ICD-10-CM | POA: Diagnosis not present

## 2022-09-28 IMAGING — US US THYROID
1 series · 13 of 25 positions shown · non-contrast
Comparison: 03/20/2019, 03/15/2019

CLINICAL DATA: Right inferior thyroid nodule, status post biopsy
03/20/2019

EXAM:
THYROID ULTRASOUND
TECHNIQUE: Ultrasound examination of the thyroid gland and adjacent soft
tissues was performed.

[Series 1: us thyroid · 0.04mm/px · 13 of 67 slices shown]
[im 1/67]
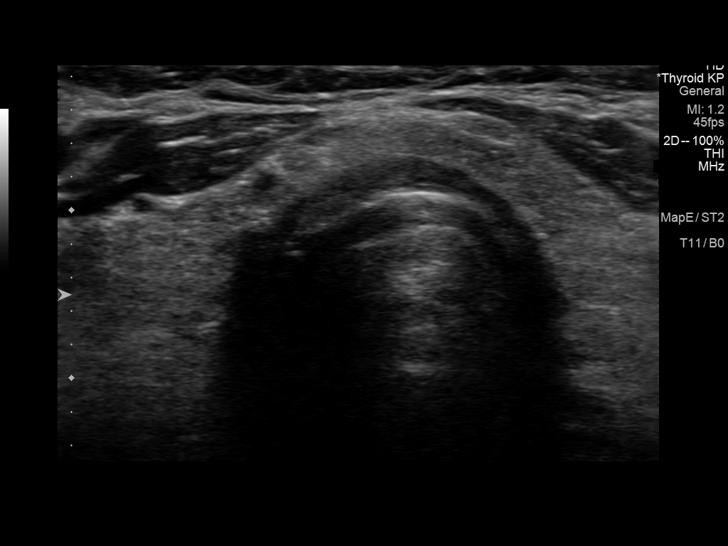
[im 6/67]
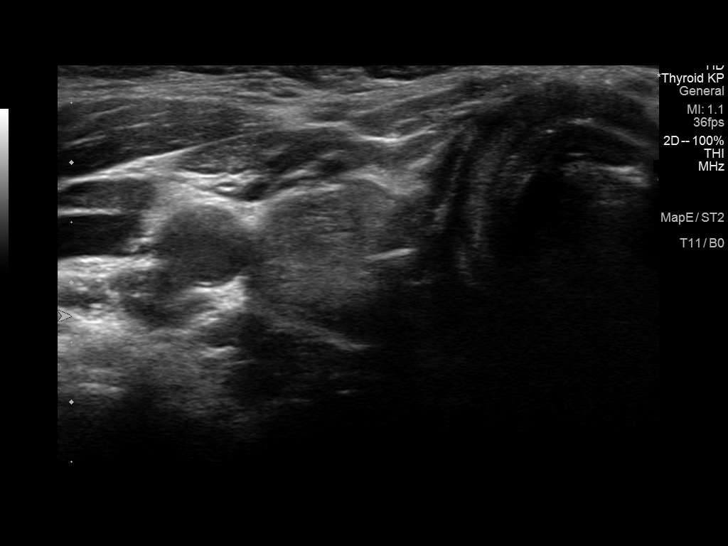
[im 12/67]
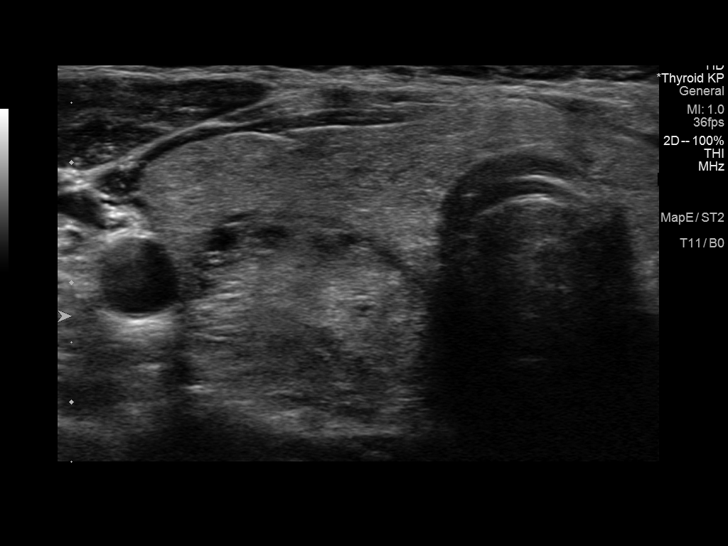
[im 17/67]
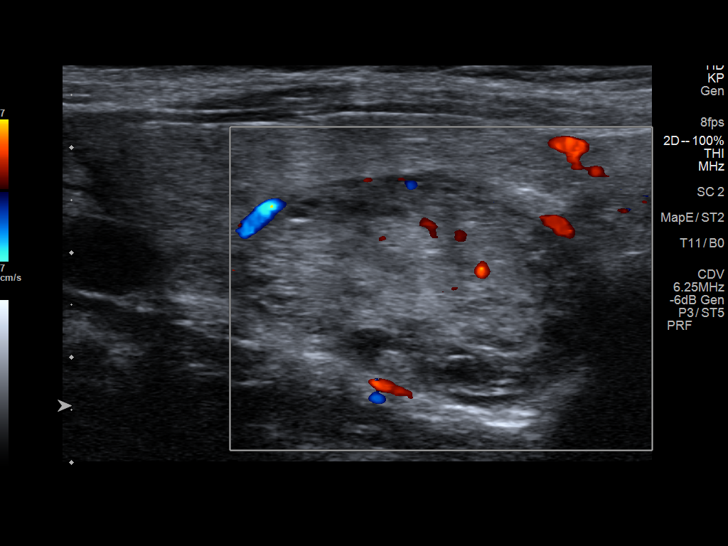
[im 23/67]
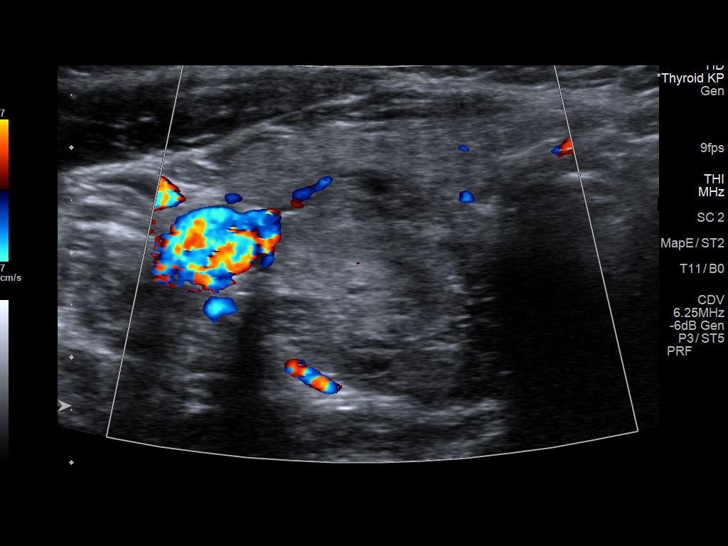
[im 28/67]
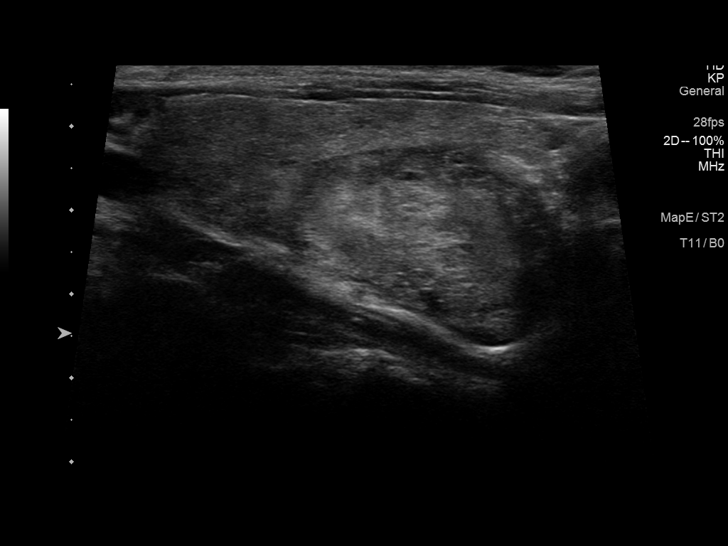
[im 34/67]
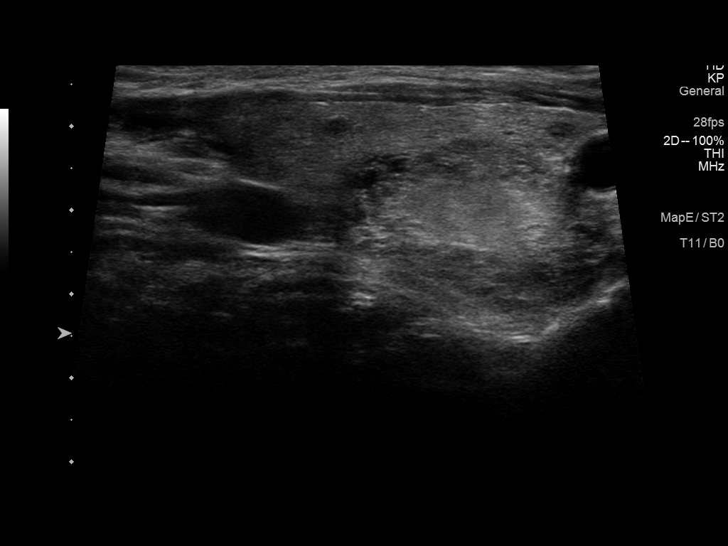
[im 39/67]
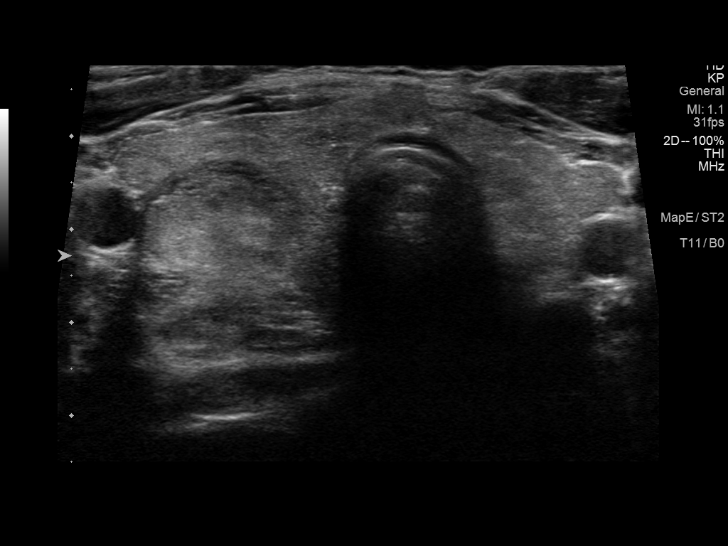
[im 45/67]
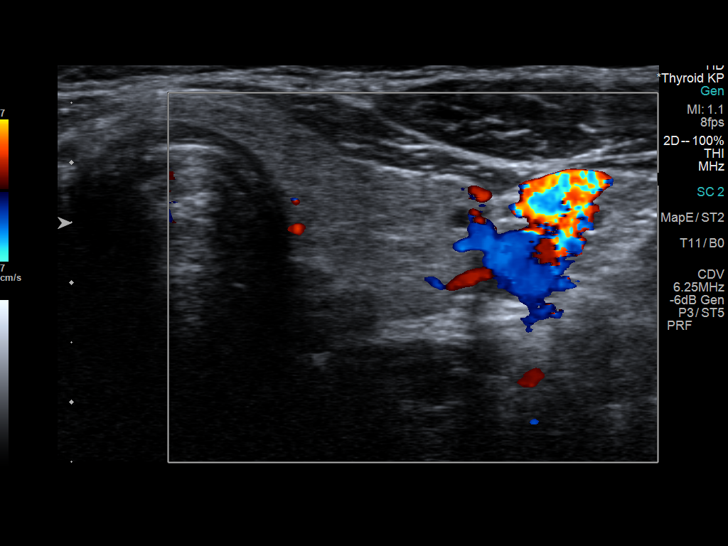
[im 50/67]
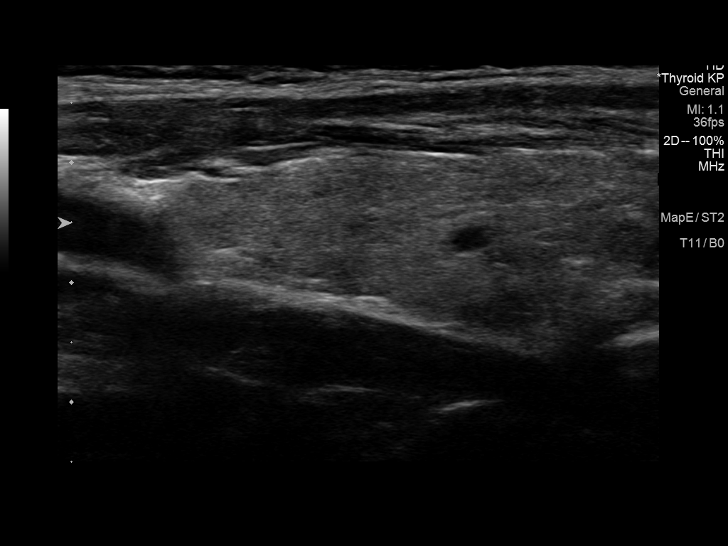
[im 56/67]
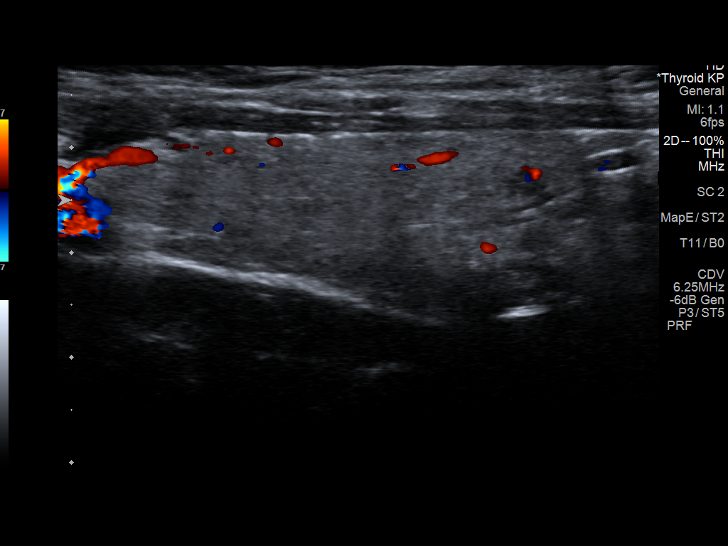
[im 61/67]
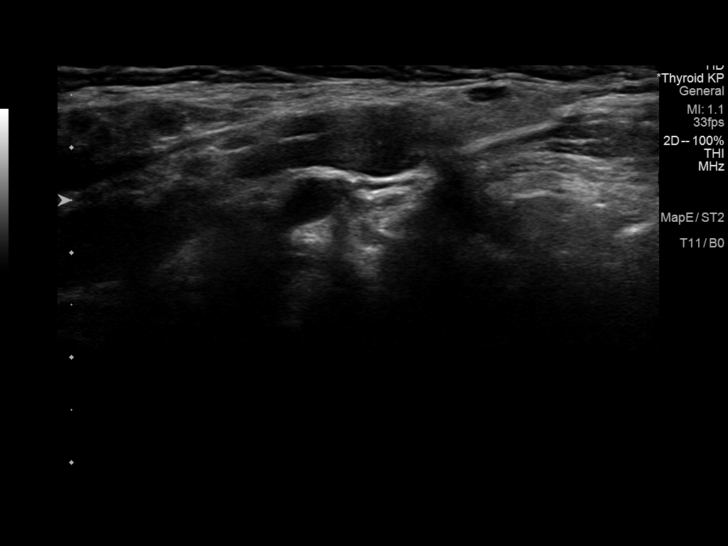
[im 67/67]
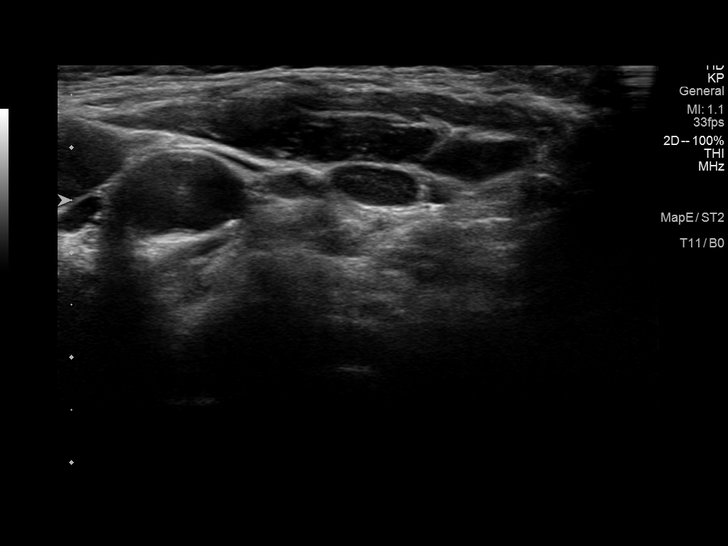

[13 of 25 positions shown; findings below may reference images not displayed]

FINDINGS: Parenchymal Echotexture: Mildly heterogenous

Isthmus: 4 mm

Right lobe: 5.5 x 2.6 x 2.3 cm

Left lobe: 4.7 x 1.6 x 1.7 cm

_________________________________________________________

Estimated total number of nodules >/= 1 cm: 1

Number of spongiform nodules >/=  2 cm not described below (TR1): 0

Number of mixed cystic and solid nodules >/= 1.5 cm not described
below (TR2): 0

_________________________________________________________

The previously biopsied right inferior thyroid solid isoechoic TR 3
type nodule measures 3.4 x 2.3 x 2.3 cm and is grossly stable in
size. Nodule was remeasured on the old exam for a more accurate
comparison. Correlate with prior pathology.

Subcentimeter scattered left thyroid cyst noted measuring 3.5 mm or
less.

No new thyroid abnormality. No hypervascularity. No regional
adenopathy.
IMPRESSION: Stable 3.4 cm right inferior TR 3 nodule, previously biopsied.
Correlate with prior pathology.

No new or enlarging thyroid nodule.

The above is in keeping with the ACR TI-RADS recommendations - [HOSPITAL] 2044;[DATE].

## 2022-12-07 ENCOUNTER — Ambulatory Visit
Admission: RE | Admit: 2022-12-07 | Discharge: 2022-12-07 | Disposition: A | Discharge status: Home / Self Care | Payer: BC Managed Care – PPO | Source: Ambulatory Visit | Attending: Family Medicine | Admitting: Family Medicine

## 2022-12-07 ENCOUNTER — Other Ambulatory Visit: Payer: Self-pay | Admitting: Family Medicine

## 2022-12-07 DIAGNOSIS — J22 Unspecified acute lower respiratory infection: Secondary | ICD-10-CM

## 2022-12-07 DIAGNOSIS — J069 Acute upper respiratory infection, unspecified: Secondary | ICD-10-CM | POA: Diagnosis not present

## 2023-02-09 DIAGNOSIS — J069 Acute upper respiratory infection, unspecified: Secondary | ICD-10-CM | POA: Diagnosis not present

## 2023-03-17 DIAGNOSIS — M25552 Pain in left hip: Secondary | ICD-10-CM | POA: Diagnosis not present

## 2023-06-08 DIAGNOSIS — M19012 Primary osteoarthritis, left shoulder: Secondary | ICD-10-CM | POA: Diagnosis not present

## 2023-06-08 DIAGNOSIS — G5601 Carpal tunnel syndrome, right upper limb: Secondary | ICD-10-CM | POA: Diagnosis not present

## 2023-08-17 DIAGNOSIS — G5603 Carpal tunnel syndrome, bilateral upper limbs: Secondary | ICD-10-CM | POA: Diagnosis not present

## 2023-08-17 DIAGNOSIS — M5412 Radiculopathy, cervical region: Secondary | ICD-10-CM | POA: Diagnosis not present

## 2023-08-17 DIAGNOSIS — M19011 Primary osteoarthritis, right shoulder: Secondary | ICD-10-CM | POA: Diagnosis not present

## 2023-08-17 DIAGNOSIS — M542 Cervicalgia: Secondary | ICD-10-CM | POA: Diagnosis not present

## 2023-08-17 DIAGNOSIS — R200 Anesthesia of skin: Secondary | ICD-10-CM | POA: Diagnosis not present

## 2023-08-29 DIAGNOSIS — M1991 Primary osteoarthritis, unspecified site: Secondary | ICD-10-CM | POA: Diagnosis not present

## 2023-08-29 DIAGNOSIS — M064 Inflammatory polyarthropathy: Secondary | ICD-10-CM | POA: Diagnosis not present

## 2023-08-29 DIAGNOSIS — R5383 Other fatigue: Secondary | ICD-10-CM | POA: Diagnosis not present

## 2023-08-29 DIAGNOSIS — M542 Cervicalgia: Secondary | ICD-10-CM | POA: Diagnosis not present

## 2023-08-29 DIAGNOSIS — M79642 Pain in left hand: Secondary | ICD-10-CM | POA: Diagnosis not present

## 2023-08-29 DIAGNOSIS — M25561 Pain in right knee: Secondary | ICD-10-CM | POA: Diagnosis not present

## 2023-08-29 DIAGNOSIS — M25562 Pain in left knee: Secondary | ICD-10-CM | POA: Diagnosis not present

## 2023-08-29 DIAGNOSIS — M79641 Pain in right hand: Secondary | ICD-10-CM | POA: Diagnosis not present

## 2023-09-19 DIAGNOSIS — M79641 Pain in right hand: Secondary | ICD-10-CM | POA: Diagnosis not present

## 2023-09-19 DIAGNOSIS — M1991 Primary osteoarthritis, unspecified site: Secondary | ICD-10-CM | POA: Diagnosis not present

## 2023-09-19 DIAGNOSIS — M79642 Pain in left hand: Secondary | ICD-10-CM | POA: Diagnosis not present

## 2023-09-19 DIAGNOSIS — M0600 Rheumatoid arthritis without rheumatoid factor, unspecified site: Secondary | ICD-10-CM | POA: Diagnosis not present

## 2023-10-17 DIAGNOSIS — M0600 Rheumatoid arthritis without rheumatoid factor, unspecified site: Secondary | ICD-10-CM | POA: Diagnosis not present

## 2023-11-02 IMAGING — MG MM DIGITAL SCREENING BILAT W/ TOMO AND CAD
8 series · 9 of 24 positions shown · non-contrast
Comparison: Previous exam(s).

CLINICAL DATA: Screening.

EXAM:
DIGITAL SCREENING BILATERAL MAMMOGRAM WITH TOMOSYNTHESIS AND CAD
TECHNIQUE: Bilateral screening digital craniocaudal and mediolateral oblique
mammograms were obtained. Bilateral screening digital breast
tomosynthesis was performed. The images were evaluated with
computer-aided detection.

[R MLO synth-2D]
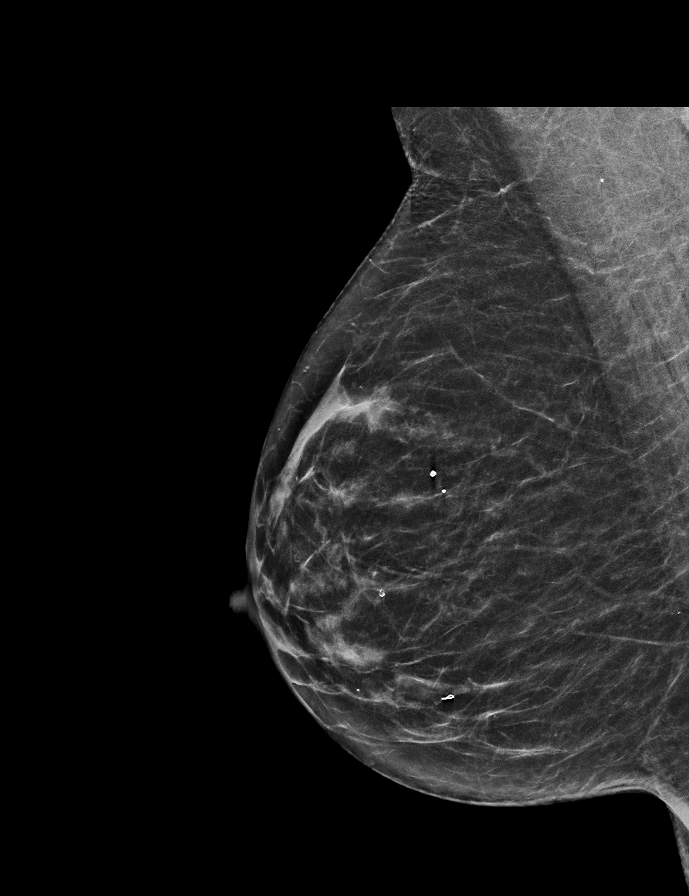

[L CC synth-2D]
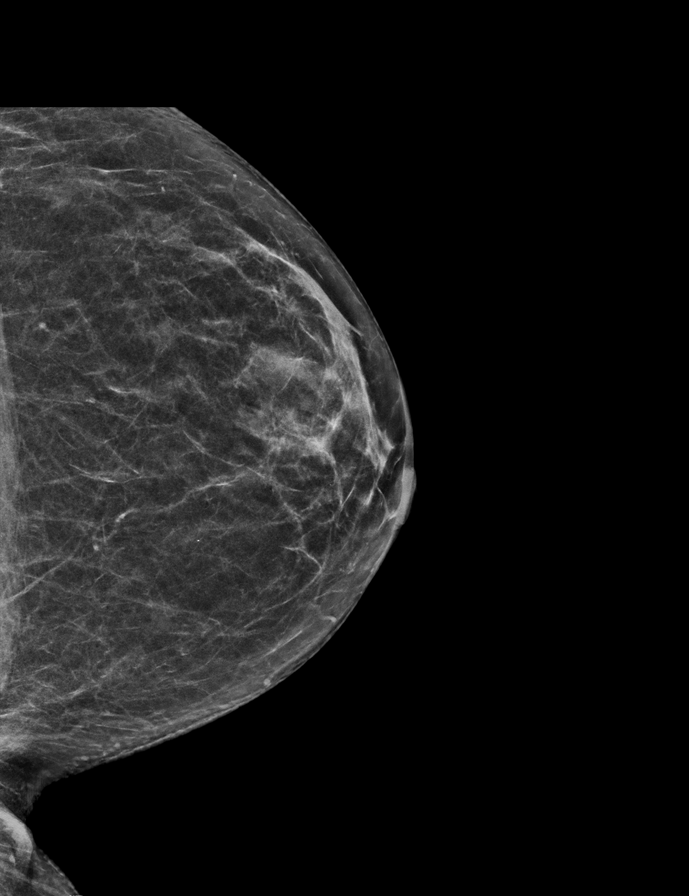

[L MLO synth-2D]
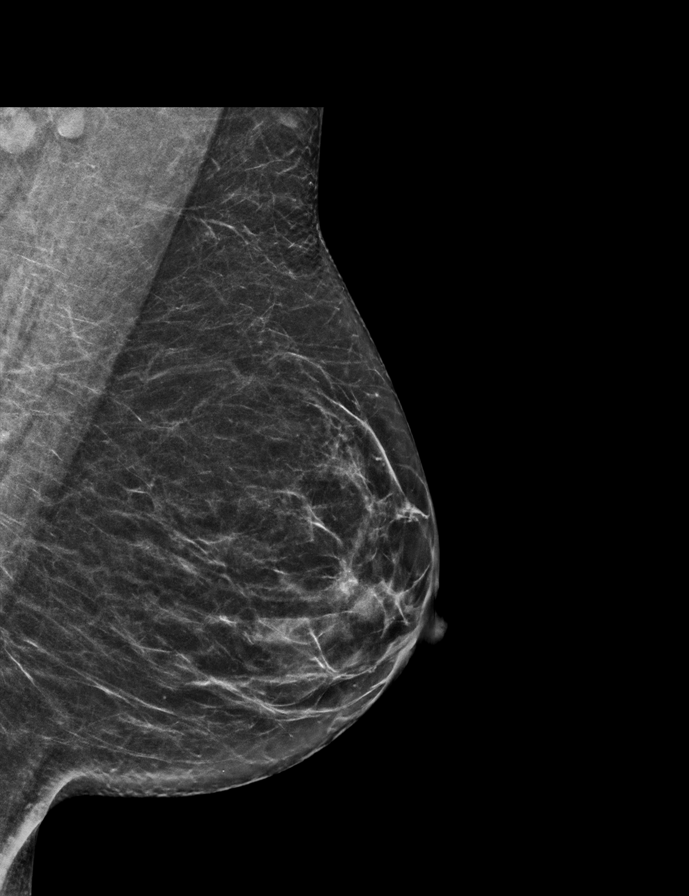

[R CC synth-2D]
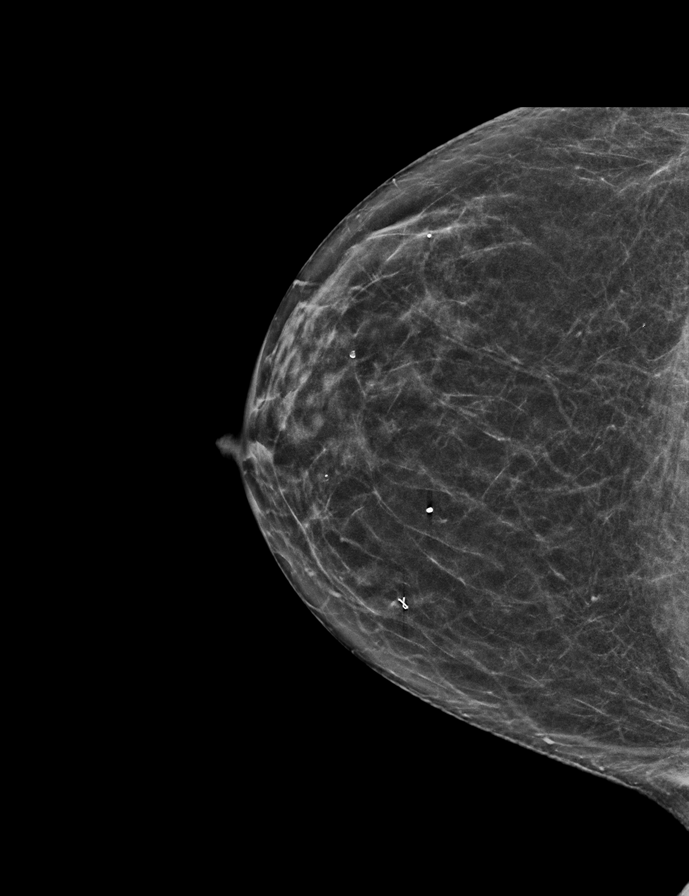

[R CC tomo · 2 of 54 frames shown]
[frame 18/54]
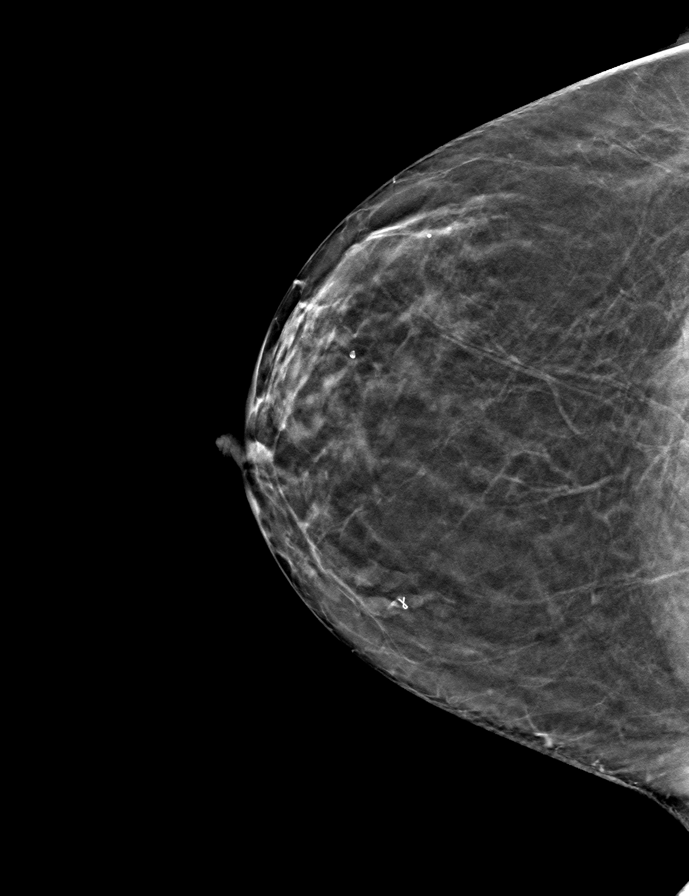
[frame 27/54]
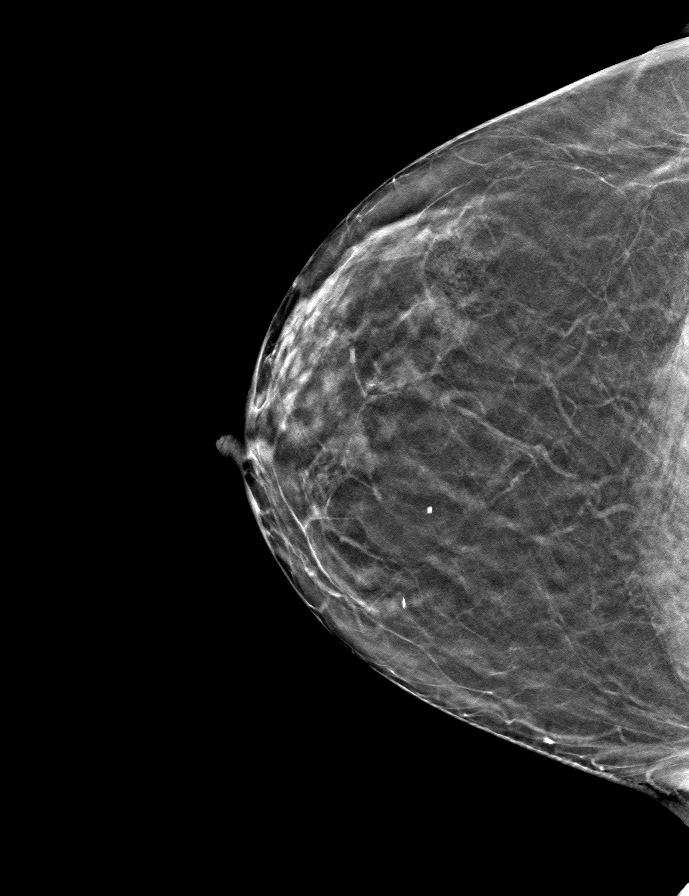

[R MLO tomo · tomo slice 29/56.0]
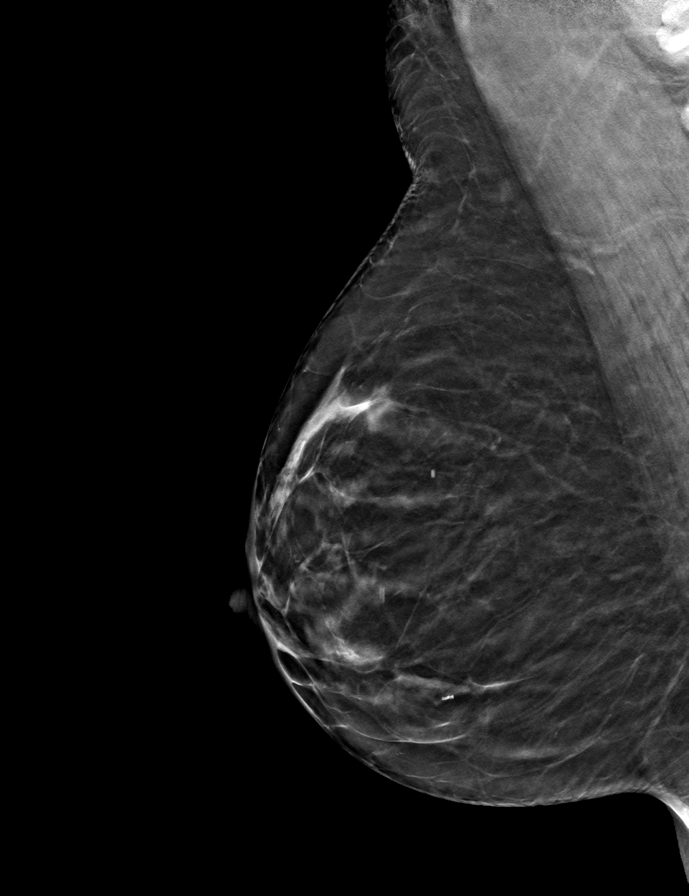

[L CC tomo · tomo slice 27/52.0]
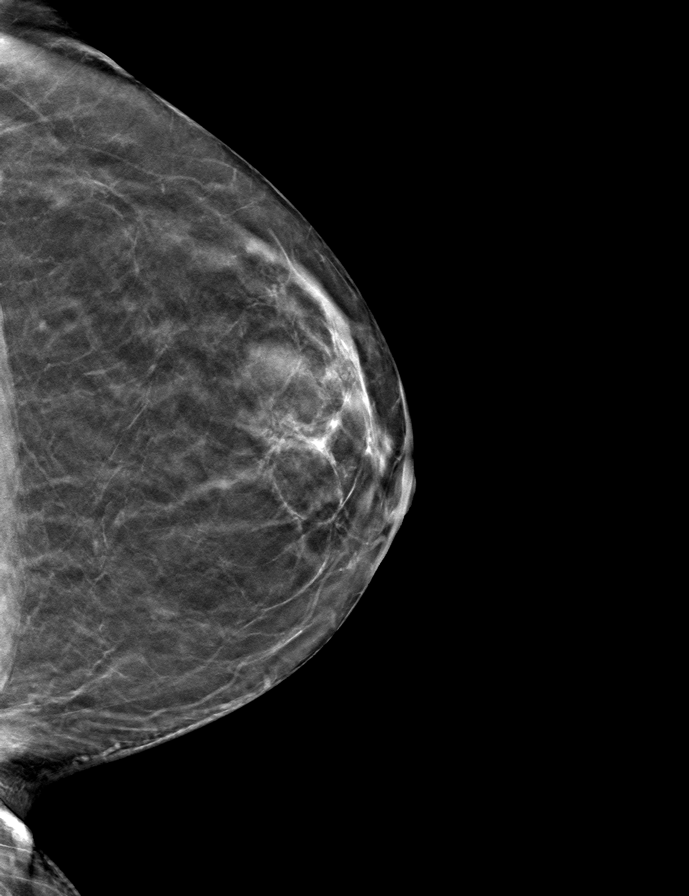

[L MLO tomo · tomo slice 29/57.0]
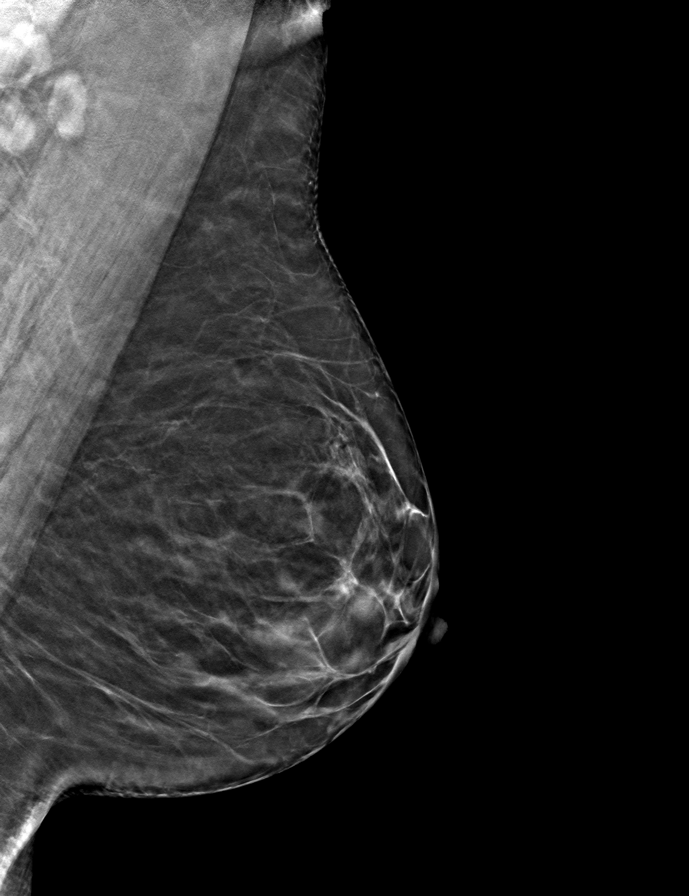

[9 of 24 positions shown; findings below may reference images not displayed]

ACR Breast Density Category b: There are scattered areas of
fibroglandular density.
FINDINGS: There are no findings suspicious for malignancy.
IMPRESSION: No mammographic evidence of malignancy. A result letter of this
screening mammogram will be mailed directly to the patient.

RECOMMENDATION:
Screening mammogram in one year. (Code:51-O-LD2)

BI-RADS CATEGORY  1: Negative.

## 2023-11-21 ENCOUNTER — Other Ambulatory Visit: Payer: Self-pay | Admitting: Family Medicine

## 2023-11-21 DIAGNOSIS — Z1231 Encounter for screening mammogram for malignant neoplasm of breast: Secondary | ICD-10-CM

## 2023-12-06 ENCOUNTER — Ambulatory Visit
Admission: RE | Admit: 2023-12-06 | Discharge: 2023-12-06 | Disposition: A | Source: Ambulatory Visit | Attending: Family Medicine | Admitting: Family Medicine

## 2023-12-06 DIAGNOSIS — Z1231 Encounter for screening mammogram for malignant neoplasm of breast: Secondary | ICD-10-CM | POA: Diagnosis not present

## 2023-12-26 DIAGNOSIS — Z0000 Encounter for general adult medical examination without abnormal findings: Secondary | ICD-10-CM | POA: Diagnosis not present

## 2023-12-26 DIAGNOSIS — I700 Atherosclerosis of aorta: Secondary | ICD-10-CM | POA: Diagnosis not present

## 2023-12-26 DIAGNOSIS — Z23 Encounter for immunization: Secondary | ICD-10-CM | POA: Diagnosis not present

## 2023-12-26 DIAGNOSIS — M0600 Rheumatoid arthritis without rheumatoid factor, unspecified site: Secondary | ICD-10-CM | POA: Diagnosis not present

## 2023-12-26 DIAGNOSIS — E785 Hyperlipidemia, unspecified: Secondary | ICD-10-CM | POA: Diagnosis not present

## 2023-12-27 ENCOUNTER — Other Ambulatory Visit: Payer: Self-pay | Admitting: Family Medicine

## 2023-12-27 DIAGNOSIS — F17200 Nicotine dependence, unspecified, uncomplicated: Secondary | ICD-10-CM

## 2024-01-09 ENCOUNTER — Ambulatory Visit
Admission: RE | Admit: 2024-01-09 | Discharge: 2024-01-09 | Disposition: A | Source: Ambulatory Visit | Attending: Family Medicine | Admitting: Family Medicine

## 2024-01-09 DIAGNOSIS — F17200 Nicotine dependence, unspecified, uncomplicated: Secondary | ICD-10-CM

## 2024-01-09 DIAGNOSIS — F17210 Nicotine dependence, cigarettes, uncomplicated: Secondary | ICD-10-CM | POA: Diagnosis not present

## 2024-02-03 DIAGNOSIS — N390 Urinary tract infection, site not specified: Secondary | ICD-10-CM | POA: Diagnosis not present

## 2024-02-25 NOTE — Progress Notes (Unsigned)
 " Cardiology Office Note:  .   Date:  02/28/2024 ID:  Vicki Pena, DOB 19-Jan-1971, MRN [id number] PCP: Vicki Therisa MATSU, PA [facility] Providers Cardiologist:  None   Patient Profile: .      PMH Hyperlipidemia Aortic atherosclerosis Tobacco dependence Age advanced two vessel coronary artery calcification on CT 01/12/24 Emphysema       History of Present Illness: .    Discussed the use of AI scribe software for clinical note transcription with the patient, who gave verbal consent to proceed.  History of Present Illness Vicki Pena is a very pleasant 54 year old female who presents for further evaluation of coronary artery calcification. She was referred by PCP following CT chest that revealed age advanced left anterior descending and right coronary artery calcification.  She notes  intermittent flutter sensations that occur randomly, not specifically with exertion. She also has chronic neck and shoulder problems that she feels may contribute to these sensations. No chest pain or dyspnea with walking. Strong family history of heart disease including her father who received stents in his late fifties and later had a large myocardial infarction requiring bypass surgery and LVAD. Her younger brother has unspecified cardiac problems. She takes rosuvastatin 5 mg daily as primary prevention since age 43. Her November lipid panel was slightly elevated after a brief lapse in therapy while caring for her ill daughter. She achieved 100 lb weight loss between 2018 and 2020 with a low-carbohydrate, low-sugar diet but has recently regained some weight and is working on portion control. She currently smokes and has attempted cessation with bupropion which she felt was not effective and she had hives after taking Chantix.  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  She denies orthopnea, edema, PND, presyncope,  syncope.  Family history: Her family history includes Arthritis in her father; Breast cancer in her maternal aunt; COPD in her mother; Diabetes in her mother; Drug abuse in her brother; Heart disease in her brother and father.  Father - stenting, starting in his 97s, had acute stent closure and massive MI, subsequent CABG and LVAD Brother - younger brother has some issues but no stents  Discussed the use of AI scribe software for clinical note transcription with the patient, who gave verbal consent to proceed.  ASCVD Risk Score: ASCVD (Atherosclerotic Cardiovascular Disease) Risk Algorithm including Known ASCVD from AHA/ACC from [email or web address] on 02/28/2024 ** All calculations should be rechecked by clinician prior to use **  RESULT SUMMARY: 2.8 % Risk of cardiovascular event (coronary or stroke death or non-fatal MI or stroke) in next 10 years.  No statin recommended because 10-year risk <5%; always encourage healthy cardiovascular lifestyle choices. Some patients with other high risk features may still be appropriate for treatment.   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 Gets a lot of steps Enjoys golf, yard work  No results found for: LIPOA  ROS: See HPI        Studies Reviewed: SABRA   EKG Interpretation Date/Time:  Wednesday February 28 2024 13:47:08 EST Ventricular Rate:  89 PR Interval:  142 QRS Duration:  74 QT Interval:  356 QTC Calculation: 433 R Axis:   59  Text Interpretation: Normal sinus rhythm Nonspecific ST abnormality No acute  changes from previous tracing Confirmed by Percy Browning [phone]) on 02/28/2024 2:08:03 PM      Risk Assessment/Calculations:             Physical Exam:   VS: BP 122/76 (BP Location: Right Arm, Patient Position: Sitting, Cuff Size: Normal)   Pulse 89   Ht 5' 3 (1.6 m)   Wt 151 lb (68.5 kg)   LMP 02/07/1998   BMI 26.75 kg/m   Wt Readings from Last 3 Encounters:  02/28/24 151 lb (68.5 kg)  02/18/22 134 lb (60.8 kg)  01/17/20 146 lb (66.2 kg)     GEN: Well nourished, well developed in no acute distress NECK: No JVD; No carotid bruits CARDIAC: RRR, no murmurs, rubs, gallops RESPIRATORY:  Clear to auscultation without rales, wheezing or rhonchi  ABDOMEN: Soft, non-tender, non-distended EXTREMITIES:  No edema; No deformity     ASSESSMENT AND PLAN: .    Assessment & Plan Coronary artery calcification Cardiac risk  Abnormal EKG Calcification on chest lung cancer screening CT suggests advanced coronary calcification of LAD and RCA. Further testing is needed to evaluate plaque.  EKG reveals normal sinus rhythm with nonspecific ST abnormality seen on previous tracing.  She denies chest pain, dyspnea, or other symptoms concerning for angina.  She does have some palpitations occasionally and upper body discomfort that she associates with shoulder pain.  Due to additional risk factors of smoking, and hyperlipidemia, we will proceed with further ischemia evaluation.  Additional lipid testing is planned for February. - Coronary CTA for ischemia evaluation and mapping of coronary anatomy - Lopressor  100 mg 2 hours prior to CT - Continue rosuvastatin - Be as physically active as possible every day and aim for at least 150 minutes of moderate intensity exercise each week - Heart healthy diet avoiding processed foods, saturated fat, sugar, and other simple carbohydrates encouraged  Hyperlipidemia LDL goal < 70 Lipid panel 88/81/74 with total cholesterol 214, triglycerides 93, HDL 87, and  LDL-C 110 which was increased from previous tests. She is getting labs rechecked next month with PCP.  Admits to lapse in rosuvastatin therapy while caring for her daughter.  She has since resumed rosuvastatin 5 mg daily and is tolerating this without concerning side effects. -Repeat lipid testing as scheduled with PCP - Recommend getting Apo B and LPa tested for additional risk stratification (will ask PCP to add these tests to scheduled labs for February) -  Continue rosuvastatin 5 mg daily for now - Heart healthy diet and regular exercise recommended  Tobacco abuse Long-standing nicotine dependence with unsuccessful cessation attempts.  She is concerned about stopping smoking and gaining weight.  She is allergic to Chantix and did not feel that bupropion was effective.  Encouraged to set a quit date and count cigarettes. Potential weight management strategies were discussed.  - Complete cessation advised      Dispo: 3 months with me  Signed, Browning Percy, NP-C "

## 2024-02-28 ENCOUNTER — Encounter (HOSPITAL_BASED_OUTPATIENT_CLINIC_OR_DEPARTMENT_OTHER): Payer: Self-pay | Admitting: Nurse Practitioner

## 2024-02-28 ENCOUNTER — Ambulatory Visit (INDEPENDENT_AMBULATORY_CARE_PROVIDER_SITE_OTHER): Admitting: Nurse Practitioner

## 2024-02-28 ENCOUNTER — Other Ambulatory Visit (HOSPITAL_BASED_OUTPATIENT_CLINIC_OR_DEPARTMENT_OTHER): Payer: Self-pay

## 2024-02-28 ENCOUNTER — Encounter (HOSPITAL_BASED_OUTPATIENT_CLINIC_OR_DEPARTMENT_OTHER): Payer: Self-pay

## 2024-02-28 VITALS — BP 122/76 | HR 89 | Ht 63.0 in | Wt 151.0 lb

## 2024-02-28 DIAGNOSIS — I2510 Atherosclerotic heart disease of native coronary artery without angina pectoris: Secondary | ICD-10-CM

## 2024-02-28 DIAGNOSIS — Z7189 Other specified counseling: Secondary | ICD-10-CM | POA: Diagnosis not present

## 2024-02-28 DIAGNOSIS — Z720 Tobacco use: Secondary | ICD-10-CM | POA: Diagnosis not present

## 2024-02-28 DIAGNOSIS — E785 Hyperlipidemia, unspecified: Secondary | ICD-10-CM | POA: Diagnosis not present

## 2024-02-28 DIAGNOSIS — R9431 Abnormal electrocardiogram [ECG] [EKG]: Secondary | ICD-10-CM

## 2024-02-28 MED ORDER — METOPROLOL TARTRATE 100 MG PO TABS: ORAL_TABLET | ORAL | 0 refills | Status: AC

## 2024-02-28 NOTE — Patient Instructions (Addendum)
 Medication Instructions:  TAKE METOPROLOL  100 MG 2 HOURS PRIOR TO CT   *If you need a refill on your cardiac medications before your next appointment, please call your pharmacy*  Lab Work: APoB/LPa AT YOUR PRIMARY CARE WHEN YOU HAVE LABS WITH THEM   If you have labs (blood work) drawn today and your tests are completely normal, you will receive your results only by: MyChart Message (if you have MyChart) OR A paper copy in the mail If you have any lab test that is abnormal or we need to change your treatment, we will call you to review the results.  Testing/Procedures: Your physician has requested that you have cardiac CT. Cardiac computed tomography (CT) is a painless test that uses an x-ray machine to take clear, detailed pictures of your heart. For further information please visit [email or web address] Please follow instruction sheet as given.  Follow-Up: At [facility], you and your health needs are our priority.  As part of our continuing mission to provide you with exceptional heart care, our providers are all part of one team.  This team includes your primary Cardiologist (physician) and Advanced Practice Providers or APPs (Physician Assistants and Nurse Practitioners) who all work together to provide you with the care you need, when you need it.  Your next appointment:   3 month(s)  Provider:   Rosaline Bane, NP   We recommend signing up for the patient portal called MyChart.  Sign up information is provided on this After Visit Summary.  MyChart is used to connect with patients for Virtual Visits (Telemedicine).  Patients are able to view lab/test results, encounter notes, upcoming appointments, etc.  Non-urgent messages can be sent to your provider as well.   To learn more about what you can do with MyChart, go to [email or web address].   Other Instructions   Your cardiac CT will be scheduled at one of the below locations:   [facility] [address] [place], [place] [address] [phone] (Severe contrast allergies only)  OR   [facility] [address] [place], [place] [address] [phone]  OR   [facility] [address] [place], [place] [address] [phone]  OR   [facility] and Vascular Tower [address]  [place], [place] [address]  OR   [facility] [address] [place], [place] [phone]  If scheduled at [facility], please arrive at the [facility] and Children's Entrance (Entrance C2) of [facility] 30 minutes prior to test start time. You can use the FREE valet parking offered at entrance C (encouraged to control the heart rate for the test)  Proceed to the [facility] Radiology Department (first floor) to check-in and test prep.  All radiology patients and guests should use entrance C2 at [facility], accessed from [facility], even though the hospital's physical address listed is [address].  If scheduled at the Heart and Vascular Tower at [address], please enter the parking lot using the [address] entrance and use the FREE valet service at the patient drop-off area. Enter the building and check-in with registration on the main floor.  If scheduled at [facility], please arrive to the Heart and Vascular Center 15 mins early for check-in and test prep.  There is spacious parking and easy access to the radiology department from the [facility] Heart and Vascular entrance. Please enter here and check-in with the desk  attendant.   If scheduled at [facility], please arrive 30 minutes early for check-in and test prep.  Please follow these instructions carefully (unless otherwise directed):  An IV will be required for this test and Nitroglycerin will be given.  Hold all erectile dysfunction medications at least 3 days (72 hrs) prior to test. (Ie viagra,  cialis, sildenafil, tadalafil, etc)   On the Night Before the Test: Be sure to Drink plenty of water . Do not consume any caffeinated/decaffeinated beverages or chocolate 12 hours prior to your test. Do not take any antihistamines 12 hours prior to your test.  On the Day of the Test: Drink plenty of water  until 1 hour prior to the test. Do not eat any food 1 hour prior to test. You may take your regular medications prior to the test.  Take metoprolol  (Lopressor ) two hours prior to test. If you take Furosemide/Hydrochlorothiazide/Spironolactone/Chlorthalidone, please HOLD on the morning of the test. Patients who wear a continuous glucose monitor MUST remove the device prior to scanning. FEMALES- please wear underwire-free bra if available, avoid dresses & tight clothing  After the Test: Drink plenty of water . After receiving IV contrast, you may experience a mild flushed feeling. This is normal. On occasion, you may experience a mild rash up to 24 hours after the test. This is not dangerous. If this occurs, you can take Benadryl  25 mg, Zyrtec , Claritin, or Allegra and increase your fluid intake. (Patients taking Tikosyn should avoid Benadryl , and may take Zyrtec , Claritin, or Allegra) If you experience trouble breathing, this can be serious. If it is severe call 911 IMMEDIATELY. If it is mild, please call our office.  We will call to schedule your test 2-4 weeks out understanding that some insurance companies will need an authorization prior to the service being performed.   For more information and frequently asked questions, please visit our website : [email or web address]  For non-scheduling related questions, please contact the cardiac imaging nurse navigator should you have any questions/concerns: Cardiac Imaging Nurse Navigators Direct Office Dial: [phone]   For scheduling needs, including cancellations and rescheduling, please call Brittany,  [phone].  Cardiac CT Angiogram A cardiac CT angiogram is a procedure to look at the heart and the area around the heart. It may be done to help find the cause of chest pains or other symptoms of heart disease. During this procedure, a substance called contrast dye is injected into a vein in the arm. The contrast highlights the blood vessels in the area to be checked. A large X-ray machine (CT scanner), then takes detailed pictures of the heart and the surrounding area. The procedure is also sometimes called a coronary CT angiogram, coronary artery scanning, or CTA. A cardiac CT angiogram allows the health care provider to see how well blood is flowing to and from the heart. The provider will be able to see if there are any problems, such as: Blockage or narrowing of the arteries in the heart. Fluid around the heart. Signs of weakness or disease in the muscles, valves, and tissues of the heart. Tell a health care provider about: Any allergies you have. This is especially important if you have had a previous allergic reaction to medicines, contrast dye, or iodine. All medicines you are taking, including vitamins, herbs, eye drops, creams, and over-the-counter medicines. Any bleeding problems you have. Any surgeries you have had. Any medical conditions you have, including kidney problems or kidney failure. Whether you are pregnant or may be pregnant. Any anxiety  disorders, chronic pain, or other conditions you have. These may increase your stress or prevent you from lying still. Any history of abnormal heart rhythms or heart procedures. What are the risks? Your provider will talk with you about risks. These may include: Bleeding. Infection. Allergic reactions to medicines or dyes. Damage to other structures or organs. Kidney damage from the contrast dye. Increased risk of cancer from radiation exposure. This risk is low. Talk with your provider about: The risks and benefits of  testing. How you can receive the lowest dose of radiation. What happens before the procedure? Wear comfortable clothing and remove any jewelry, glasses, dentures, and hearing aids. Follow instructions from your provider about eating and drinking. These may include: 12 hours before the procedure Avoid caffeine. This includes tea, coffee, soda, energy drinks, and diet pills. Drink plenty of water  or other fluids that do not have caffeine in them. Being well hydrated can prevent complications. 4-6 hours before the procedure Stop eating and drinking. This will reduce the risk of nausea from the contrast dye. Ask your provider about changing or stopping your regular medicines. These include: Diabetes medicines. Medicines to treat problems with erections (erectile dysfunction). If you have kidney problems, you may need to receive IV hydration before and after the test. What happens during the procedure?  Hair on your chest may need to be removed so that small sticky patches called electrodes can be placed on your chest. These will transmit information that helps to monitor your heart during the procedure. An IV will be inserted into one of your veins. You might be given a medicine to control your heart rate during the procedure. This will help to ensure that good images are obtained. You will be asked to lie on an exam table. This table will slide in and out of the CT machine during the procedure. Contrast dye will be injected into the IV. You might feel warm, or you may get a metallic taste in your mouth. You may be given medicines to relax or dilate the arteries in your heart. If you are allergic to contrast dyes or iodine you may be given medicine before the test to reduce the risk of an allergic reaction. The table that you are lying on will move into the CT machine tunnel for the scan. The person running the machine will give you instructions while the scans are being done. You may be asked  to: Keep your arms above your head. Hold your breath for short periods. Stay very still, even if the table is moving. The procedure may vary among providers and hospitals. What can I expect after the procedure? After your procedure, it is common to have: A metallic taste in your mouth from the contrast dye. A feeling of warmth. A headache from the heart medicine. Follow these instructions at home: Take over-the-counter and prescription medicines only as told by your provider. If you are told, drink enough fluid to keep your pee pale yellow. This will help to flush the contrast dye out of your body. Most people can return to their normal activities right after the procedure. Ask your provider what activities are safe for you. It is up to you to get the results of your procedure. Ask your provider, or the department that is doing the procedure, when your results will be ready. Contact a health care provider if: You have any symptoms of allergy to the contrast dye. These include: Shortness of breath. Rash or hives. A racing heartbeat.  You notice a change in your peeing (urination). This information is not intended to replace advice given to you by your health care provider. Make sure you discuss any questions you have with your health care provider. Document Revised: 08/27/2021 Document Reviewed: 08/27/2021 Elsevier Patient Education  2024 [employer].

## 2024-07-02 ENCOUNTER — Ambulatory Visit (HOSPITAL_BASED_OUTPATIENT_CLINIC_OR_DEPARTMENT_OTHER): Admitting: Nurse Practitioner
